# Patient Record
Sex: Female | Born: 1975 | Race: White | Marital: Single | Smoking: Former smoker
Health system: Northeastern US, Community
[De-identification: ages and names within clinical notes are randomized; demographics above are authoritative.]

## PROBLEM LIST (undated history)

## (undated) DIAGNOSIS — IMO0001 Reserved for inherently not codable concepts without codable children: Secondary | ICD-10-CM

## (undated) HISTORY — DX: Reserved for inherently not codable concepts without codable children: IMO0001

## (undated) HISTORY — PX: NO SIGNIFICANT SURGICAL HISTORY: 1000005

---

## 2010-10-24 ENCOUNTER — Encounter (HOSPITAL_BASED_OUTPATIENT_CLINIC_OR_DEPARTMENT_OTHER): Payer: Self-pay | Admitting: Allergy

## 2010-12-26 ENCOUNTER — Ambulatory Visit (HOSPITAL_BASED_OUTPATIENT_CLINIC_OR_DEPARTMENT_OTHER): Payer: PRIVATE HEALTH INSURANCE | Admitting: Internal Medicine

## 2011-01-23 ENCOUNTER — Ambulatory Visit (HOSPITAL_BASED_OUTPATIENT_CLINIC_OR_DEPARTMENT_OTHER): Payer: PRIVATE HEALTH INSURANCE | Admitting: Internal Medicine

## 2011-01-23 ENCOUNTER — Encounter (HOSPITAL_BASED_OUTPATIENT_CLINIC_OR_DEPARTMENT_OTHER): Payer: Self-pay | Admitting: Internal Medicine

## 2011-01-23 VITALS — BP 100/70 | HR 81 | Temp 97.3°F | Wt 119.0 lb

## 2011-01-23 DIAGNOSIS — K5900 Constipation, unspecified: Secondary | ICD-10-CM

## 2011-01-23 DIAGNOSIS — K2970 Gastritis, unspecified, without bleeding: Principal | ICD-10-CM

## 2011-01-23 LAB — CBC, PLATELET & DIFFERENTIAL
ABSOLUTE BASO COUNT: 0 10*3/uL (ref 0.0–0.1)
ABSOLUTE EOSINOPHIL COUNT: 0 10*3/uL (ref 0.0–0.8)
ABSOLUTE IMM GRAN COUNT: 0.02 10*3/uL (ref 0.00–0.03)
ABSOLUTE LYMPH COUNT: 2 10*3/uL (ref 0.6–5.9)
ABSOLUTE MONO COUNT: 0.5 10*3/uL (ref 0.2–1.4)
ABSOLUTE NEUTROPHIL COUNT: 4.1 10*3/uL (ref 1.6–8.3)
BASOPHIL %: 0.6 % (ref 0.0–1.2)
EOSINOPHIL %: 0.3 % (ref 0.0–7.0)
HEMATOCRIT: 38.8 % (ref 34.1–44.9)
HEMOGLOBIN: 13.1 g/dL (ref 11.2–15.7)
IMMATURE GRANULOCYTE %: 0.3 % (ref 0.0–0.4)
LYMPHOCYTE %: 29.4 % (ref 15.0–54.0)
MEAN CORP HGB CONC: 33.8 g/dL (ref 31.0–37.0)
MEAN CORPUSCULAR HGB: 28.5 pg (ref 26.0–34.0)
MEAN CORPUSCULAR VOL: 84.5 fL (ref 80.0–100.0)
MEAN PLATELET VOLUME: 11.3 fL (ref 8.7–12.5)
MONOCYTE %: 7.9 % (ref 4.0–13.0)
NEUTROPHIL %: 61.5 % (ref 40.0–75.0)
PLATELET COUNT: 302 10*3/uL (ref 150–400)
RBC DISTRIBUTION WIDTH STD DEV: 39.4 fL (ref 35.1–46.3)
RBC DISTRIBUTION WIDTH: 12.8 % (ref 11.5–14.3)
RED BLOOD CELL COUNT: 4.59 M/uL (ref 3.90–5.20)
WHITE BLOOD CELL COUNT: 6.7 10*3/uL (ref 4.0–11.0)

## 2011-01-23 MED ORDER — RANITIDINE HCL 300 MG PO CAPS
300.0000 mg | ORAL_CAPSULE | Freq: Every evening | ORAL | Status: DC
Start: 2011-01-23 — End: 2011-02-22

## 2011-01-23 NOTE — Progress Notes (Signed)
S/ Christy Gonzales is a 35 year old female is new pt.     Here for:     564.00A Constipation  535.44F Gastritis  - c/o in last 3 months, note incr midepig discomfort, esp if not eat and or sometimes after meals. Taking PPI BID for last 26mo and this helps but still w/ mild symptoms. Reports hx of gastritis/dudodenitis, scoped in [place] 2005. Was takign PPI on prn basis prior, few times per wk, but in last 26mo, taking daily. She admits increase stressors may be factor, but denies any dietary changes.   ROS GI: The patient denies  anorexia, nausea or vomiting, dysphagia, change in bowel habits or black or bloody stools.    -also noted 4 days ago had bad lower abd pain that radiated to low back side, resolved after 1 day;s he is not takign nsaids, but does occas take tylenol w/ relief. FDMP 11/26, regular.   Admits does have menstrual cramps.     Patient Active Problem List    Gastritis [535.44F]         Date Noted: 01/24/2011            2005- reports had EGD in [place] showing duodenitis            and gastritis- given PPI w/ relief and dietary            discretion.      Constipation [564.00A]         Date Noted: 01/23/2011            Since childhood, takes fiber prn, BM 2-3x per wk.         Meds: form [facility]: omeprazole 20 mg BID for last 26months.     FDLMP 11/26, regular       Social History   Marital Status: Single  Spouse Name: N/A    Years of Education: N/A  Number of Children: 0     Occupational History  housecleaning       Social History Main Topics   Smoking status: Never Smoker     Smokeless tobacco:     Alcohol Use: No    Drug Use: No    Sexually Active: Not Currently  Partner(s): Female     Other Topics Concern   None on file     Social History Narrative    01/2011- emigrated from [place] 2005, where her family stil resides.    -single,  lives w/ cousin, safe, no DV.     -working in Human resources officer       Family History    GI Mother     Comment: hx PUD, had 'ulcer surgery''     Hypertension Mother     Stroke Mother      GI Sister     GI Sister        OBJECTIVE:  BP 100/70  Pulse 81  Temp(Src) 97.3 F (36.3 C) (Oral)  Wt 119 lb (53.978 kg)  SpO2 97%  GENERAL: WD, WN, NAD, A&O x3.  HEENT: op clear, neck supple  CVS/LUNGS: S1 and S2 normal, no murmurs, clicks, gallops or rubs. Regular rate and rhythm. Chest is clear; no wheezes or rales. No edema or JVD.  ABD: The abdomen is soft without tenderness, guarding, mass, rebound or organomegaly. Bowel sounds are normal. No CVA tenderness or inguinal adenopathy noted.  BACK: Cervical, thoracic and lumbar spine exam is normal without tenderness, masses or kyphoscoliosis. Full range of motion without pain is  noted.    ASSESSMENT:  535.9F Gastritis  Comment: new pt: has hx of this, and seems to have worsening of symptoms in last 3 mo despite taking PPI. Admit recent stressors but denies nsaid use or other diet indiscretions; denies rectal bleeding/black stools; Ddx: PUD. Advise her to cont PPI BID for now, and will add H2 blocker at bedtime; keep food diary, counsel on diet discretion again.  Plan: ROUTINE VENIPUNCTURE, CBC + PLT + AUTO DIFF,         COMPREHENSIVE METABOLIC PANEL, HELICOBACTER         PYLORI, IGG        -consider GI referral for EGD repeat.       564.00A Constipation  Comment: - long hx of this since childhood; no change of BM pattern; encourage her to take daily fiber supplement, ie: flax seed oil.   Plan: THYROID SCREEN TSH          rtc for PE;pap    DISP- pt has been well, and not see doctor or had any ED visits in over 7 yrs since arrived to [place].

## 2011-01-24 ENCOUNTER — Encounter (HOSPITAL_BASED_OUTPATIENT_CLINIC_OR_DEPARTMENT_OTHER): Payer: Self-pay | Admitting: Internal Medicine

## 2011-01-24 DIAGNOSIS — K2970 Gastritis, unspecified, without bleeding: Principal | ICD-10-CM | POA: Insufficient documentation

## 2011-01-31 LAB — COMPREHENSIVE METABOLIC PANEL
ALANINE AMINOTRANSFERASE: 19 IU/L (ref 7–35)
ALBUMIN: 4.2 g/dl (ref 3.4–4.8)
ALKALINE PHOSPHATASE: 51 IU/L (ref 25–106)
ANION GAP: 9 mmol/L (ref 3–11)
ASPARTATE AMINOTRANSFERASE: 21 IU/L (ref 8–34)
BILIRUBIN TOTAL: 0.4 mg/dl (ref 0.2–1.1)
BUN (UREA NITROGEN): 9 mg/dl (ref 6–20)
CALCIUM: 9.6 mg/dl (ref 8.6–10.3)
CARBON DIOXIDE: 28 mmol/L (ref 22–32)
CHLORIDE: 102 mmol/L (ref 101–111)
CREATININE: 0.7 mg/dl (ref 0.4–1.2)
ESTIMATED GLOMERULAR FILT RATE: >60 mL/min (ref >60)
Glucose Random: 96 mg/dl (ref 74–160)
POTASSIUM: 3.9 mmol/L (ref 3.5–5.1)
SODIUM: 139 mmol/L (ref 135–144)
TOTAL PROTEIN: 7.2 g/dl (ref 5.9–7.5)

## 2011-01-31 LAB — HELICOBACTER PYLORI IGG: HELICOBACTER PYLORI IGG: NEGATIVE

## 2011-01-31 LAB — THYROID SCREEN TSH REFLEX FT4: THYROID SCREEN TSH REFLEX FT4: 1.71 u[IU]/mL (ref 0.34–5.60)

## 2011-02-13 ENCOUNTER — Encounter (HOSPITAL_BASED_OUTPATIENT_CLINIC_OR_DEPARTMENT_OTHER): Payer: Self-pay | Admitting: Internal Medicine

## 2011-02-22 ENCOUNTER — Ambulatory Visit (HOSPITAL_BASED_OUTPATIENT_CLINIC_OR_DEPARTMENT_OTHER): Payer: PRIVATE HEALTH INSURANCE | Admitting: Internal Medicine

## 2011-02-22 ENCOUNTER — Encounter (HOSPITAL_BASED_OUTPATIENT_CLINIC_OR_DEPARTMENT_OTHER): Payer: Self-pay | Admitting: Internal Medicine

## 2011-02-22 VITALS — BP 100/60 | HR 86 | Temp 98.1°F | Wt 123.0 lb

## 2011-02-22 DIAGNOSIS — R5383 Other fatigue: Secondary | ICD-10-CM

## 2011-02-22 DIAGNOSIS — F419 Anxiety disorder, unspecified: Secondary | ICD-10-CM

## 2011-02-22 DIAGNOSIS — M62838 Other muscle spasm: Secondary | ICD-10-CM

## 2011-02-22 DIAGNOSIS — K2970 Gastritis, unspecified, without bleeding: Principal | ICD-10-CM

## 2011-02-22 DIAGNOSIS — J329 Chronic sinusitis, unspecified: Secondary | ICD-10-CM

## 2011-02-22 DIAGNOSIS — F439 Reaction to severe stress, unspecified: Secondary | ICD-10-CM

## 2011-02-22 MED ORDER — PSEUDOEPHEDRINE HCL ER 120 MG PO TB12
120.00 mg | ORAL_TABLET | Freq: Two times a day (BID) | ORAL | Status: AC
Start: 2011-02-22 — End: 2011-03-04

## 2011-02-22 MED ORDER — OMEPRAZOLE 40 MG PO CPDR
40.00 mg | DELAYED_RELEASE_CAPSULE | Freq: Every day | ORAL | Status: AC
Start: 2011-02-22 — End: 2011-03-24

## 2011-02-22 MED ORDER — CYCLOBENZAPRINE HCL 10 MG PO TABS
10.00 mg | ORAL_TABLET | Freq: Two times a day (BID) | ORAL | Status: AC | PRN
Start: 2011-02-22 — End: 2011-03-01

## 2011-02-22 MED ORDER — RANITIDINE HCL 300 MG PO CAPS
300.0000 mg | ORAL_CAPSULE | Freq: Every evening | ORAL | Status: AC
Start: 2011-02-22 — End: 2011-03-24

## 2011-02-22 NOTE — Progress Notes (Signed)
S/ Christy Gonzales is a 36 year old female is here for f/u and has several concerns: via telephone interpreter.    535.62F Gastritis  (primary encounter diagnosis)  -reports long hx of this, recurrent GI symptoms for last 3 mo, despite takign PPI BID. I saw her last month, asked to watch diet, reviewed discretion, and had her add H2 blocker in PM, which she was done. Notes slight impomvement, has cut out tomatoes and spicy foods which make symptoms worse.   ROS GI: The patient denies abdominal or flank pain, anorexia, nausea or vomiting, dysphagia, change in bowel habits or black or bloody stools. No wt loss.     V62.89N Stress  - ongoing stressors which she wish not to go into, but agreed may be contributing to GI symptoms.   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     473.9E Sinusitis  - c/o 4 days of cold/cough/congesitno, and left sinus pressure; taking tyelenol w/ some relief. Wants to know if need abx for her 'infxn' as she has had sinus infection in past   ROS: no fever, no chills. No sore throat. Nasal d/c is scant. Cough is scant.     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   ROS: The patient denies swelling, numbness, tingling or weakness in the upper extremities.    780.79B Fatigue  300.00E Anxiety  -- she reports being tired, and long hx of being easily anxious. Easily worried about things, ie, if have a doctor's appt coming up, she worries about it and then has poor sleep. She wants something to help her sleep b/c feels that is what makes her tired, lack of good sleep.     Patient Active Problem List:     Constipation [564.00A]     Gastritis [535.62F]      No current outpatient prescriptions on file prior to encounter.  OBJECTIVE:  BP 100/60  Pulse 86  Temp(Src) 98.1 F (36.7 C) (Oral)  Wt 123 lb (55.792 kg)  SpO2  99%  GENERAL: WD, WN, NAD, A&O x3. Well appearing, not ill.   Mental status exam; she is alert, orient to time, person and place. Normal thought content, speech, affect, mood and dress are noted.  HEENT: eomi, perrla, op clear; turbs- slightly erythematous but no nasal d/c; left maxillary sinus pain to palpation; ears- wnl; neck supple w/out LAD  CVS/LUNGS: S1 and S2 normal, no murmurs, clicks, gallops or rubs. Regular rate and rhythm. Chest is clear; no wheezes or rales. No edema or JVD.  LYMPH: No lymphadenopathy in the anterior or posterior neck, supraclavicular, axillary or inguinal areas. No hepato-splenomegaly noted.  SKIN: I note only benign skin findings. No unusual rashes or suspicious skin lesions noted. Nails appear normal.    ASSESSMENT:  535.62F Gastritis  (primary encounter diagnosis)  Comment: - reports long hx of this, EGD in [place] showing gastrits/dudoenitis; recurrent GI symptoms for last 3 mo, despite takign PPI BID. I  had asked her to watch diet, reviewed discretion, and had her add H2 blocker in PM, which she was done. Notes slight impomvement, has cut out tomatoes and spicy foods which make symptoms worse.  Refer GI. RF PPI/H2 blocker for now.   Plan: REFERRAL TO GASTROENTEROLOGY ( INT), omeprazole        (PRILOSEC) 40  MG capsule, ranitidine (ZANTAC)         300 MG capsule            V62.89N Stress  Comment: - denies depression and not wish to go into details w/ me; she is open to mental health counseling.   Plan: REFERRAL TO ADULT PSYCHIATRY ( INT)            473.9E Sinusitis  Comment: - left maxillary pain x 4 days; supportive care w/ fluids, nsaids, decongestant.   Plan: pseudoephedrine (SUDAFED 12 HOUR) 120 MG 12 hr         tablet            728.85FS Muscle spasms of neck  Comment: -work related. Trial of flexeril prn, esp qhs, as may help w/ her poor sleep.   Plan: cyclobenzaprine (FLEXERIL) 10 MG tablet        Potential medication side effects were discussed with the patient; let me  know if any occur.    780.79B Fatigue  Comment: - suspect mental health issues as main factor. No constitutional symptoms. clinically euthryoid.   Plan: REFERRAL TO ADULT PSYCHIATRY ( INT)        Most Recent Weight Reading(s)  02/22/11 : 123 lb (55.792 kg)  01/23/11 : 119 lb (53.978 kg)    300.00E Anxiety  Comment: - reports long hx of easily getting anxious which then interferes w/ her sleep quality. Requesting med to help her sleep.   Plan: - advise her to f/u w/ mental heath    rtc for PE/labs and PHQ9  screening    I have reviewed the past medical, surgical, social and family history and updated these sections of EpicCare as relevant. All interim labs, test results, and consult notes were reviewed and discussed with Christy Gonzales. Medications were reconciled during this visit and a current medication list was given to the patient at the end of the visit.

## 2011-02-23 DIAGNOSIS — F439 Reaction to severe stress, unspecified: Secondary | ICD-10-CM | POA: Insufficient documentation

## 2011-02-23 DIAGNOSIS — F419 Anxiety disorder, unspecified: Secondary | ICD-10-CM | POA: Insufficient documentation

## 2011-03-03 ENCOUNTER — Telehealth (HOSPITAL_BASED_OUTPATIENT_CLINIC_OR_DEPARTMENT_OTHER): Payer: Self-pay | Admitting: Internal Medicine

## 2011-03-03 NOTE — Telephone Encounter (Incomplete)
{  API STATUS:[id number]}

## 2011-03-27 ENCOUNTER — Telehealth (HOSPITAL_BASED_OUTPATIENT_CLINIC_OR_DEPARTMENT_OTHER): Payer: Self-pay | Admitting: Internal Medicine

## 2011-03-27 NOTE — Telephone Encounter (Signed)
1st outreach for pe/pap smear  L/M for patient to reschedule pe/pap smear.    [employer]

## 2011-04-03 ENCOUNTER — Ambulatory Visit (HOSPITAL_BASED_OUTPATIENT_CLINIC_OR_DEPARTMENT_OTHER): Payer: Self-pay

## 2011-04-06 ENCOUNTER — Ambulatory Visit (HOSPITAL_BASED_OUTPATIENT_CLINIC_OR_DEPARTMENT_OTHER): Payer: PRIVATE HEALTH INSURANCE | Admitting: Physician Assistant

## 2011-04-21 ENCOUNTER — Telehealth (HOSPITAL_BASED_OUTPATIENT_CLINIC_OR_DEPARTMENT_OTHER): Payer: Self-pay | Admitting: Internal Medicine

## 2011-04-21 ENCOUNTER — Encounter (HOSPITAL_BASED_OUTPATIENT_CLINIC_OR_DEPARTMENT_OTHER): Payer: Self-pay | Admitting: Internal Medicine

## 2011-04-21 NOTE — Telephone Encounter (Signed)
2nd outreach for PE & pap  L/M in [place] for patient to call office & schedule PE/pap    Howard Pouch

## 2011-05-30 ENCOUNTER — Ambulatory Visit (HOSPITAL_BASED_OUTPATIENT_CLINIC_OR_DEPARTMENT_OTHER): Payer: PRIVATE HEALTH INSURANCE | Admitting: Gastroenterology

## 2011-05-30 ENCOUNTER — Ambulatory Visit (HOSPITAL_BASED_OUTPATIENT_CLINIC_OR_DEPARTMENT_OTHER): Payer: Self-pay | Admitting: Gastroenterology

## 2011-05-30 VITALS — BP 92/55 | HR 88 | Temp 97.6°F | Resp 16 | Wt 119.0 lb

## 2011-05-30 DIAGNOSIS — K219 Gastro-esophageal reflux disease without esophagitis: Principal | ICD-10-CM

## 2011-05-30 DIAGNOSIS — R109 Unspecified abdominal pain: Secondary | ICD-10-CM

## 2011-05-30 MED ORDER — OMEPRAZOLE 20 MG PO CPDR
20.00 mg | DELAYED_RELEASE_CAPSULE | Freq: Two times a day (BID) | ORAL | Status: AC
Start: 2011-05-30 — End: 2012-05-29

## 2011-05-30 NOTE — Progress Notes (Signed)
This office note has been dictated. Account number [id number]

## 2011-05-31 NOTE — Progress Notes (Signed)
Date of Service: 05/30/2011    Ms. Christy Gonzales was seen in consultation at the request of Dr. Alfonse Ras for burning epigastric pain and heartburn, lower abdominal cramping, and constipation.    The patient was seen in the presence of a Portuguese-speaking interpreter.    HISTORY OF PRESENT ILLNESS:  The patient is a 36 year old woman who has had epigastric pain and heartburn for many years.  She recently has been waking up at night choking from her reflux secretions.  She was placed on omeprazole, which helps, but this was switched to ranitidine, which does not help as much.  She has had endoscopies in [place] that showed gastritis and esophagitis about 8 years ago.    The other complaint is periumbilical and lower abdominal cramping, which occurs every 2-3 weeks and is worse during times of stress and around the time of her period.  She has never been told that she has endometriosis.  She has not had any children and does not recall whether birth control pills, which she has taken in the past, made a difference the pain that she gets.    The third problem is constipation, and this sometimes makes her abdominal pain worse, and passage of gas or a bowel movement can make it a little better.  She denies having any blood in the stools.    PAST MEDICAL HISTORY:  Constipation, gastritis, and heartburn.    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    FAMILY HISTORY:  Her mother had ulcer disease.    ALLERGIES:  No known allergies.    MEDICATIONS:  Ranitidine.    REVIEW OF SYSTEMS:  She denies any fever, chills, or decreased appetite.  She has lost a few pounds, which she attributes to her present illness.  She denies any visual or auditory symptoms.  She denies chest pain or palpitations.  She denies breast pain or discharge.  She denies chronic cough or shortness of breath.  GI symptomatology as in the present illness.  She denies any genitourinary symptoms.  Her periods are painful but not severely  so.  She denies joint pain or swelling.  She denies skin lesions or rash.  She denies loss of consciousness or seizures.    PHYSICAL EXAMINATION:  GENERAL:  She is healthy looking, in no distress.  VITAL SIGNS:  Blood pressure 92/55, pulse 88, temperature 97.6, oxygen saturation 100%, and weight 119 pounds.  HEAD AND ENT:  No icterus, pallor, or mouth lesions.  NECK:  No venous congestion or masses.  HEART:  Rhythmic and regular with no murmurs.  LUNGS:  Clear and resonant.  ABDOMEN:  The abdomen is soft with no organomegaly or masses.  There is epigastric tenderness.  EXTREMITIES:  No edema.  SKIN:  No lesions.    ASSESSMENT AND PLAN:  This patient has had heartburn and burning epigastric pain for many years, and it is important to perform an endoscopy to rule out Barrett's esophagus.  She is on the constipated side, so I discussed with her the diet that she should follow and told her to use a bulk-forming agent such as Citrucel or Metamucil on a regular basis.    ___________________________  Reviewed and Electronically Signed By: Marrianne Mood MD  Sig Date: 05/31/2011  Sig Time: 10:03:04  Dictated By: Marrianne Mood MD  Dict Date: 05/30/2011 Dict Time: 01 57 PM    Dictation Date and Time:05/30/2011 13:57:21  Transcription Date and Time:05/30/2011 15:25:13  eScription Dictation id: [id number] Confirmation # :[id number]      cc: Westley Chandler MD

## 2011-06-08 ENCOUNTER — Encounter (HOSPITAL_BASED_OUTPATIENT_CLINIC_OR_DEPARTMENT_OTHER): Payer: Self-pay | Admitting: Gastroenterology

## 2011-06-08 ENCOUNTER — Ambulatory Visit (HOSPITAL_BASED_OUTPATIENT_CLINIC_OR_DEPARTMENT_OTHER)
Admit: 2011-06-08 | Disposition: A | Discharge status: Home / Self Care | Payer: Self-pay | Source: Ambulatory Visit | Attending: Gastroenterology | Admitting: Gastroenterology

## 2011-06-08 LAB — GI OPERATIVE NOTE

## 2011-06-08 MED ORDER — DIPHENHYDRAMINE HCL 50 MG/ML IJ SOLN
25.0000 mg | Freq: Once | INTRAMUSCULAR | Status: DC | PRN
Start: 2011-06-08 — End: 2011-06-08

## 2011-06-08 MED ORDER — SODIUM CHLORIDE 0.9 % IV SOLN
INTRAVENOUS | Status: DC
Start: 2011-06-08 — End: 2011-06-08

## 2011-06-08 MED ORDER — MIDAZOLAM HCL 5 MG/5ML IJ SOLN
INTRAMUSCULAR | Status: AC
Start: 2011-06-08 — End: 2011-06-08
  Administered 2011-06-08: 6 mg via INTRAVENOUS
  Filled 2011-06-08: qty 10

## 2011-06-08 MED ORDER — MIDAZOLAM HCL 5 MG/5ML IJ SOLN
0.5000 mg | INTRAMUSCULAR | Status: DC
Start: 2011-06-08 — End: 2011-06-08

## 2011-06-08 MED ORDER — SODIUM CHLORIDE 0.9 % IV SOLN
INTRAVENOUS | Status: DC
Start: 2011-06-08 — End: 2011-06-08
  Filled 2011-06-08: qty 500

## 2011-06-08 MED ORDER — SIMETHICONE 40 MG/0.6ML PO SUSP
Freq: Once | ORAL | Status: DC
Start: 2011-06-08 — End: 2011-06-08

## 2011-06-08 MED ORDER — FENTANYL CITRATE 0.05 MG/ML IJ SOLN
25.0000 ug | INTRAMUSCULAR | Status: DC
Start: 2011-06-08 — End: 2011-06-08

## 2011-06-08 MED ORDER — FENTANYL CITRATE 0.05 MG/ML IJ SOLN
INTRAMUSCULAR | Status: AC
Start: 2011-06-08 — End: 2011-06-08
  Administered 2011-06-08: 150 ug via INTRAVENOUS
  Filled 2011-06-08: qty 4

## 2011-06-08 MED ORDER — WATER FOR IRRIGATION, STERILE IR SOLN
Freq: Once | Status: DC
Start: 2011-06-08 — End: 2011-06-08

## 2011-06-08 NOTE — H&P (Signed)
GI Pre-procedure History and Physical Short Form  Christy Gonzales is an 36 year old female.    Chief Complaint: She is being scheduled for Upper GI endoscopy    The history is provided by the patient. No language interpreter was used.           Active Problems:  Patient Active Problem List:     Constipation [564.00A]     Gastritis [535.20F]     Anxiety [300.00E]     Stress [V62.89N]      History (Medical, Surgical, Social, Family):  History reviewed. No pertinent past medical history.    Past Surgical History    NO SIGNIFICANT SURGICAL HISTORY          Social History   Marital Status: Single  Spouse Name: N/A    Years of Education: N/A  Number of Children: 0     Occupational History  housecleaning       Social History Main Topics   Smoking status: Never Smoker     Smokeless tobacco:     Alcohol Use: No    Drug Use: No    Sexually Active: Not Currently  Partner(s): Female     Other Topics Concern   None on file     Social History Narrative    01/2011- emigrated from [place] 2005, where her family still resides.    -single,  lives w/ cousin, safe, no DV.     -working in Human resources officer       Family History    GI Mother     Comment: hx PUD, had 'ulcer surgery''     Hypertension Mother     Stroke Mother     GI Sister     GI Sister          Allergies:   Review of Patient's Allergies indicates:  No Known Allergies    Medications:     Current outpatient prescriptions ordered prior to encounter:  ranitidine (ZANTAC) 150 MG tablet Take 150 mg by mouth 2 (two) times daily. Disp:  Rfl:    omeprazole (PRILOSEC) 20 MG capsule Take 1 capsule by mouth 2 (two) times daily. Disp: 60 capsule Rfl: 12         Vitals:  BP 117/73  Pulse 80  Temp(Src) 98.5 F (36.9 C) (Temporal)  Resp 14  Ht 5' 1.81" (1.57 m)  Wt 120 lb (54.432 kg)  BMI 22.08 kg/m2  SpO2 100%  LMP 06/01/2011    Review of Systems   Constitutional: Negative for fever, appetite change and fatigue.   HENT: Negative.    Respiratory: Negative.    Cardiovascular: Negative.     Gastrointestinal: Negative.    Neurological: Negative.           Physical Exam   Vitals reviewed.  Constitutional: She is oriented to person, place, and time. She appears well-developed and well-nourished. No distress.   HENT:   Head: Normocephalic and atraumatic.   Mouth/Throat: Oropharynx is clear and moist.   Eyes: Conjunctivae are normal.   Neck: Normal range of motion. Neck supple.   Cardiovascular: Normal rate and normal heart sounds.    No murmur heard.  Pulmonary/Chest: Effort normal and breath sounds normal.   Abdominal: Soft. Bowel sounds are normal. She exhibits no mass (No organomegaly).   Musculoskeletal: She exhibits no edema.   Neurological: She is alert and oriented to person, place, and time.   Skin: Skin is warm and dry. She is not diaphoretic.  Airway Evaluation:  Gag reflex intact: Yes  Ability to open mouth wide:   Full  Dentures:  No  Loose teeth:  No  Neck range of motion  Full    Mallampati Airway Classification: Class I     A soft palate, fauces, uvula, anterior and posterior tonsil pillars are seen.  Mallampati Airway Classification:     ASA Classification: ASA Class I (a normally healthy patient)    Assessment:  Proceed with procedure

## 2011-06-08 NOTE — Discharge Instructions (Signed)
[facility] DISCHARGE INSTRUCTIONS     Quando voc for para casa  possvel que se sinta sonolento(a). Descanse bastante pelo resto do dia.   When you return home you may feel sleepy. Get plenty of rest for the remainder of the day.     Se voc tiver recebido algum sedative para o procedimento NO DIRIJA, NO  MANUSEIE MQUINAS NEM TOME NENHUMA DECISO IMPORTANTE durante o resto do dia.  If you received sedation for your procedure DO NOT DRIVE, OPERATE MACHINERY, OR MAKE IMPORTANT DECISIONS for the remainder of the day.     Aps ter feito uma ENDOSCOPIA DIGESTIVA ALTA (GASTROENTEROSCOPIA)  normal se ter uma dor de garganta branda que pode durar por alguns [place], mas se voc tiver  DORES NO PDepois ter feito uma COLONOSCOPIA  normal ter gases e sentir inchao abdominal, mas se voc tiver uma DOR ABDOMINAL SEVERA entre em contato com o seu mdico imediatamente.   It is normal after having a COLONOSCOPY to feel a little gassy and bloated, but if you develop SEVERE ABDOMINAL PAIN call your doctor immediately.      Se tiver feito uma bipsia ou Polipectomia voc ter que suspender a aspirina ou os medicamentos que contenham aspirina por {3} [place].                  If you had a biopsy or Polypectomy you will need to hold aspirin or medication containing aspirin for ___days.     Se voc tiver feito uma bipsia ou Polipectomia voc ter que suspender o medicamento coumadin por {NUMBERS [id number] [place].                                           If you had a biopsy or Polypectomy you will need to hold your coumadin for___days.     Se voc tiver feito uma bipsia ou Polipectomia voc ter que suspender qualquer medicamento tais como Advil, Motrin, Naproxin, Ibuprofen etc por {3} [place].  If you had a biopsy or Polypectomy you will need to hold any medication such as Advil, Motrin, Naproxin, Ibuprofen etc for___days.     Entre em contato com o seu mdico se houver qualquer  outro sintoma fora do normal.  Call you physician for any other unusual symptoms.     Se por qualquer razo voc no conseguir entrar em contato com o seu mdico v para a sala de [facility] pr[facility]a.   If for any reason you are unable to reach your doctor go to the nearest Emergency Room.    Instrues Especficas:***  Specific Instructions: Hiatal hernia. One esophageal polyp removed. Await pathology results. Keep follow up appointment,    Preoperatively reviewed by Admitting RN

## 2011-06-13 LAB — SURGICAL PATH SPECIMEN

## 2011-07-12 ENCOUNTER — Ambulatory Visit (HOSPITAL_BASED_OUTPATIENT_CLINIC_OR_DEPARTMENT_OTHER): Payer: PRIVATE HEALTH INSURANCE | Admitting: Family Medicine

## 2011-07-31 ENCOUNTER — Ambulatory Visit (HOSPITAL_BASED_OUTPATIENT_CLINIC_OR_DEPARTMENT_OTHER): Payer: PRIVATE HEALTH INSURANCE | Admitting: Family Medicine

## 2011-07-31 ENCOUNTER — Encounter (HOSPITAL_BASED_OUTPATIENT_CLINIC_OR_DEPARTMENT_OTHER): Payer: Self-pay | Admitting: Family Medicine

## 2011-07-31 VITALS — BP 100/60 | HR 68 | Temp 96.6°F | Ht 62.6 in | Wt 122.0 lb

## 2011-07-31 DIAGNOSIS — G8929 Other chronic pain: Secondary | ICD-10-CM

## 2011-07-31 DIAGNOSIS — Z0000 Encounter for general adult medical examination without abnormal findings: Principal | ICD-10-CM

## 2011-07-31 DIAGNOSIS — L301 Dyshidrosis [pompholyx]: Secondary | ICD-10-CM

## 2011-07-31 DIAGNOSIS — A630 Anogenital (venereal) warts: Secondary | ICD-10-CM

## 2011-07-31 DIAGNOSIS — R102 Pelvic and perineal pain: Secondary | ICD-10-CM

## 2011-07-31 LAB — URINALYSIS
BILIRUBIN, URINE: NEGATIVE
CASTS: NONE SEEN PER LPF
CRYSTALS: NONE SEEN
GLUCOSE, URINE: NEGATIVE MG/DL
KETONE, URINE: NEGATIVE MG/DL
LEUKOCYTE ESTERASE: NEGATIVE
NITRITE, URINE: NEGATIVE
PH URINE: 7 (ref 5.0–8.0)
PROTEIN, URINE: NEGATIVE MG/DL
SPECIFIC GRAVITY URINE: 1.023 (ref 1.003–1.035)

## 2011-07-31 LAB — URINE DIP (POINT OF CARE)
BILIRUBIN, URINE: NEGATIVE
GLUCOSE, URINE: NEGATIVE mg/dl
KETONE, URINE: NEGATIVE mg/dl
LEUKOCYTE ESTERASE: NEGATIVE
NITRITE, URINE: NEGATIVE
PH URINE: 7.5 (ref 5.0–8.0)
PROTEIN, URINE: NEGATIVE mg/dl (ref 0–15)
SPECIFIC GRAVITY URINE: 1.01 (ref 1.003–1.030)
UROBILINOGEN URINE: 0.2 mg/dl (ref 0.2–1.0)

## 2011-07-31 MED ORDER — TRIAMCINOLONE ACETONIDE 0.1 % EX OINT
TOPICAL_OINTMENT | Freq: Two times a day (BID) | CUTANEOUS | Status: AC
Start: 2011-07-31 — End: 2011-08-10

## 2011-07-31 NOTE — Progress Notes (Signed)
Christy Gonzales is a 36 year old female here for physical and pap smear.    Abdominal pain:  - has seen GI, endoscopy showed hiatal hernia, otherwise normal  - thought she has endometriosis  - lower abdomen, b/l  - almost daily when wakes up in am, sometimes during day  - sometimes like cramps  - is taking fiber supplements, bowel movements daily  - not improved with bowel movement  - sometimes very hard, sometimes soft  - somedays lasts all day, sometimes goes away  - when has the pain doesn't eat much, drinks more fluid  - sometimes worse about 1 week before and after her menses  - no blood in stool  - nothing makes it better or worse  - sometimes heavy periods, sometimes not  - LMP 07/17/2011  - no urinary symptoms    Hand:  - small little bumps and cracks on fingers  - hurts and pruritic  - works in cleaning  - wears gloves     Genital lesion:  - has been there for 1.5 years  - removed it by herself before  - now back  - not itchy or painful  - last sexual contact 3 years ago  - no hx of STI's    REVIEW OF SYSTEMS: otherwise feels well, no HA, no CP, no SOB, no chills, no significant increase or decreased in weight, no joint pain      Patient Active Problem List:     Constipation     Gastritis     Anxiety     Stress        Current Outpatient Prescriptions on File Prior to Visit:  ranitidine (ZANTAC) 150 MG tablet Take 150 mg by mouth 2 (two) times daily. Disp:  Rfl:    omeprazole (PRILOSEC) 20 MG capsule Take 1 capsule by mouth 2 (two) times daily. Disp: 60 capsule Rfl: 12     No current facility-administered medications on file prior to visit.    Review of Patient's Allergies indicates:  No Known Allergies      Past Medical History    No significant past medical history            Past Surgical History    NO SIGNIFICANT SURGICAL HISTORY           Family History    GI Mother     Comment: hx PUD, had 'ulcer surgery''     Hypertension Mother     Stroke Mother     GI Sister     GI Sister     Heart FamHxNeg     Diabetes  FamHxNeg          Social History   Marital Status: Single  Spouse Name: N/A    Years of Education: N/A  Number of Children: 0     Occupational History  housecleaning       Social History Main Topics   Smoking status: Former Smoker     Quit date: 07/31/2003    Smokeless tobacco:     Alcohol Use: No    Drug Use: No    Sexually Active: Not Currently  Partner(s): Female    Comment: last sexual encounter 3 years ago     Other Topics Concern   None on file     Social History Narrative    01/2011- emigrated from [place] 2005, where her family still resides.    -single,  lives w/ cousin, safe, no DV.     -  working in house cleaning        BP 100/60  Pulse 68  Temp(Src) 96.6 F (35.9 C) (Oral)  Ht 5' 2.6" (1.59 m)  Wt 122 lb (55.339 kg)  BMI 21.89 kg/m2  SpO2 100%  LMP 07/17/2011  GEN: average weight female, comfortable, NAD, well groomed  HEENT- NC/A. PERRLA, MMM. Tympanic membranes normal,  No LAD, no thyroidmegaly.   Resp- CTAB, no wheezes, rales, ronchi.  CV- RRR, no m/g/r  Abd- Soft, non-tender on palpation. No masses.  No HSM. Normoactive bowel sounds.  MSK- Strength 5/5 throughout. Normal gait.   Ext- No edema, clubbing, or cyanosis. Pulses 2+ throughout.   Neuro- Alert and oriented x3, CN II-XII grossly intact. Reflexes 2+ throughout  Pelvic exam: normal vagina and vulva, 5 mm flesh colored pedenculated lesion above left labia majora, normal cervix without lesions, polyps, + tenderness, nulliparous os, uterus normal size, shape, consistency, no mass, but + tenderness, adnexa normal in size without mass or tenderness, pap smear done today, DNA probe for GC/chlamydia obtained.  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 Of endometriosis, worse with menses, pain with pelvic exam, also consider gonorrhea/chlamydia infection  Plan: ORDER FOR ULTRASOUND, URINE DIP (POINT OF         CARE), URINALYSIS, GC/chlamydia  - u/s, if normal and UA normal, consider trial of OCPs    (078.11) Genital warts  Comment: exam and hx c/w this  Plan: return in 2 weeks f/u above, will use cryotherapy at that time for genital wart      *Patient understood and agreed with plan  *Follow up prn  *AVS given  *All questions addressed    *I have reviewed the past medical, surgical, social and family history and updated these sections of EpicCare as relevant. All interim labs, test results, and consult notes were reviewed and discussed. Medications were reconciled during this visit and a current medication list was given to the patient at the end of the visit.     Arleta Creek, MD

## 2011-08-01 LAB — HUMAN PAPILLOMAVIRUS (HPV): HUMAN PAPILLOMAVIRUS: NEGATIVE

## 2011-08-01 LAB — CHLAMYDIA GC NAAT
GENPROBE CHLAMYDIA: NEGATIVE
GENPROBE GC: NEGATIVE

## 2011-08-02 LAB — HIV 1 AND 2 PLUS O ANTIBODY: HIV 1 AND 2 PLUS O SCREEN: NONREACTIVE

## 2011-08-02 LAB — CYTOPATH, C/V, THIN LAYER

## 2011-08-02 NOTE — Addendum Note (Signed)
Addended byArleta Creek on: 08/02/2011 08:51 AM     Modules accepted: Orders

## 2011-08-03 LAB — URINE CULTURE/COLONY COUNT

## 2011-08-24 ENCOUNTER — Ambulatory Visit (HOSPITAL_BASED_OUTPATIENT_CLINIC_OR_DEPARTMENT_OTHER): Payer: PRIVATE HEALTH INSURANCE | Admitting: Gastroenterology

## 2011-08-24 VITALS — BP 101/62 | HR 53 | Temp 97.8°F | Resp 16 | Wt 122.0 lb

## 2011-08-24 DIAGNOSIS — K5900 Constipation, unspecified: Secondary | ICD-10-CM

## 2011-08-24 DIAGNOSIS — K449 Diaphragmatic hernia without obstruction or gangrene: Secondary | ICD-10-CM

## 2011-08-24 DIAGNOSIS — K228 Other specified diseases of esophagus: Principal | ICD-10-CM

## 2011-08-24 DIAGNOSIS — K2281 Esophageal polyp: Secondary | ICD-10-CM

## 2011-08-24 NOTE — Progress Notes (Signed)
This office note has been dictated. Account number [id number]

## 2011-08-25 NOTE — Progress Notes (Signed)
Date of Service: 08/24/2011    HISTORY OF PRESENT ILLNESS:  Christy Gonzales was seen in followup on August 24, 2011 after she had an upper endoscopy to investigate epigastric pain.    She was seen today in the presence of a Portuguese-speaking interpreter.    The upper endoscopy showed a polypoid lesion in the distal esophagus, but no other abnormality.  The pathology of the lesion showed squamous epithelium with slight papillary architecture suggestive of a squamous papilloma.    The patient complains of abdominal pain, constipation, and bloating.  These complaints have been present for a long time.    PHYSICAL EXAMINATION:  GENERAL:  She is healthy looking and in no distress.  VITAL SIGNS:  Blood pressure 101/62, pulse 53, temperature 97.8, and weight 122 pounds.  ABDOMEN:  Shows no organomegaly, masses, or tenderness.    ASSESSMENT AND PLAN:  I explained the findings to the patient, and I told her that the lesion that was seen is usually benign, but it is reasonable to perform another endoscopy in a year's time to make sure that it has not regrown.    I also told her that her complaints are most compatible with irritable bowel.  She said that she tried taking Citrucel for her constipation, but this did not work.  She found on her own that Jannifer Hick works well when she takes it, so I advised her to take Downs on a regular basis.    ___________________________  Reviewed and Electronically Signed By: Marrianne Mood MD  Sig Date: 08/28/2011  Sig Time: 14:56:46  Dictated By: Marrianne Mood MD  Dict Date: 08/25/2011 Dict Time: 03 04 PM    Dictation Date and Time:08/25/2011 15:04:04  Transcription Date and Time:08/25/2011 17:43:03  eScription Dictation id: [id number] Confirmation # :[id number]      cc: Westley Chandler MD    DICTATED BY: Marrianne Mood MDKarim [facility] MDD:08/25/2011 15:04:04 T:08/25/2011 17:43:03 2N Job#: [id number]

## 2011-08-28 ENCOUNTER — Encounter (HOSPITAL_BASED_OUTPATIENT_CLINIC_OR_DEPARTMENT_OTHER): Payer: Self-pay | Admitting: Internal Medicine

## 2011-08-29 ENCOUNTER — Telehealth (HOSPITAL_BASED_OUTPATIENT_CLINIC_OR_DEPARTMENT_OTHER): Payer: Self-pay | Admitting: Family Medicine

## 2011-08-29 NOTE — Progress Notes (Signed)
Called to discuss results with patient. UA + for blood, want to repeat UA, if persistent will send to uro-gyn. Also u/s is pending.    Sonya Gunnoe M. Katrinka Blazing, MD

## 2011-09-06 ENCOUNTER — Telehealth (HOSPITAL_BASED_OUTPATIENT_CLINIC_OR_DEPARTMENT_OTHER): Payer: Self-pay | Admitting: Family Medicine

## 2011-09-06 DIAGNOSIS — R319 Hematuria, unspecified: Principal | ICD-10-CM

## 2011-09-06 NOTE — Progress Notes (Signed)
Called patient again. Let her know that I want to repeat a UA. Order placed.   Also to go in for u/s if symptoms persist (abdominal pain).  Will send letter to home, this is 2nd call.    Amoree Newlon M. Katrinka Blazing, MD

## 2012-02-05 ENCOUNTER — Telehealth (HOSPITAL_BASED_OUTPATIENT_CLINIC_OR_DEPARTMENT_OTHER): Payer: Self-pay | Admitting: Internal Medicine

## 2012-02-05 NOTE — Progress Notes (Signed)
Confirmed with [facility], patient has refills on file for Omeprazole 20mg  from 05/30/11. Pharmacy will process refill request, patient will be able to pick up medication later today. No refill request made at this time.

## 2012-02-05 NOTE — Telephone Encounter (Signed)
Message copied by Aliene Altes on Mon Feb 05, 2012 12:51 PM  ------       Message from: Emelda Fear       Created: Mon Feb 05, 2012 12:47 PM       Regarding: needing refills/ out  of the medication        Contact: [phone]         [facility] IM[facility]              Person calling on behalf of patient:  Patient needing refills/ out  of the medication               May list multiple medications in this section              Medicine Name:omeprazole (PRILOSEC) 20 MG capsule                      Documented patient preferred pharmacies:        [facility] ([facility])       Phone: [phone] Fax: [phone]                                                   ------

## 2013-09-15 ENCOUNTER — Encounter (HOSPITAL_BASED_OUTPATIENT_CLINIC_OR_DEPARTMENT_OTHER): Payer: Self-pay | Admitting: Physician Assistant

## 2013-09-15 ENCOUNTER — Ambulatory Visit (HOSPITAL_BASED_OUTPATIENT_CLINIC_OR_DEPARTMENT_OTHER): Payer: PRIVATE HEALTH INSURANCE | Admitting: Physician Assistant

## 2013-09-15 VITALS — BP 117/65 | HR 75 | Temp 97.2°F | Wt 128.0 lb

## 2013-09-15 DIAGNOSIS — K5900 Constipation, unspecified: Secondary | ICD-10-CM

## 2013-09-15 DIAGNOSIS — R300 Dysuria: Secondary | ICD-10-CM

## 2013-09-15 DIAGNOSIS — Z7189 Other specified counseling: Secondary | ICD-10-CM

## 2013-09-15 DIAGNOSIS — K228 Other specified diseases of esophagus: Principal | ICD-10-CM

## 2013-09-15 DIAGNOSIS — Z1331 Encounter for screening for depression: Secondary | ICD-10-CM

## 2013-09-15 DIAGNOSIS — K2281 Esophageal polyp: Secondary | ICD-10-CM

## 2013-09-15 LAB — URINE DIP (POINT OF CARE)
BILIRUBIN, URINE: NEGATIVE
GLUCOSE, URINE: NEGATIVE mg/dl
KETONE, URINE: NEGATIVE mg/dl
LEUKOCYTE ESTERASE: NEGATIVE
NITRITE, URINE: NEGATIVE
PH URINE: 7 (ref 5.0–8.0)
PROTEIN, URINE: NEGATIVE mg/dl (ref 0–15)
SPECIFIC GRAVITY URINE: 1.02 (ref 1.003–1.030)
UROBILINOGEN URINE: 0.2 mg/dl (ref 0.2–1.0)

## 2013-09-15 NOTE — Patient Instructions (Signed)
Constipação em adultos   (Constipation, Adult)  A constipação é quando uma pessoa tem menos do que 3 evacuações por semana; tem dificuldade em evacuar; ou tem fezes secas, duras, ou maiores do que o normal. Conforme envelhecemos, a constipação é mais comum. Se tentar curar a constipação com medicamentos que o fazem evacuar (laxantes), o problema pode piorar. O uso prolongado de laxantes pode enfraquecer os músculos do cólon. Uma dieta pobre em fibras, não tomar líquidos o suficiente e tomar certos medicamentos pode piorar a constipação.   CAUSAS   · Certos medicamentos, como antidepressivos e para a dor, suplementos à base de ferro, antiácidos e diuréticos.    · Certas doenças, como diabetes, síndrome do intestino irritável (SII), doença da tireoide ou depressão.    · Não tomar água o suficiente.    · Não comer alimentos ricos em fibras.    · Estresse ou viagem.  · Falta de atividades físicas ou exercícios.  · Não ir ao banheiro quando tem vontade de evacuar.  · Ignorar a urgência em evacuar.  · Uso excessivo de laxantes.  SINTOMAS   · Ter menos do que 3 evacuações por semana.    · Esforço para evacuar.    · Ter fezes duras, secas ou maiores do que o normal.    · Sentir-se pesado ou inchado.    · Dor na parte inferior do abdômen.  · Não sentir alívio após a evacuação.  DIAGNÓSTICO   O médico tomará seu histórico e realizará um exame físico. Exames posteriores podem ser feitos para a constipação severa. Alguns exames podem incluir:   · Um raio-X de enema de bário para examinar o reto, cólon e algumas vezes, seu intestino delgado.  · Uma sigmoidoscopia para examinar a parte inferior de seu cólon.  · Uma colonoscopia para examinar todo o seu cólon.  TRATAMENTO   O tratamento dependerá da severidade de sua constipação e o que a está causando. Algumas dietas incluem tomar mais fluidos e comer alimentos mais ricos em fibras. Tratamentos de estilo de vida podem incluir exercícios regulares. Se essas recomendações de dieta  e estilo de vida não ajudarem, o médico pode recomendar tomar medicamentos laxantes de venda livre para ajudá-lo a evacuar. Medicamentos com receita médica podem ser prescritos se os de venda livre não funcionarem.   INSTRUÇÕES PARA TRATAMENTO DOMICILIAR   · Aumente a ingestão de fibras dietéticas em sua dieta, tais como frutas, vegetais, grãos integrais e feijão. Limite alimentos ricos em gordura e açúcar refinado em sua dieta, como batatas fritas, hambúrguer, biscoitos recheados, balas e refrigerante.    · Um suplemento de fibras pode ser adicionado à sua dieta se você não tiver fibras o suficiente dos alimentos.    · Beba líquidos o suficiente para manter sua urina clara ou na cor amarelo pálida.    · Faça exercícios regularmente, conforme receitado pelo seu médico.    · Vá para o banheiro quando tiver vontade de evacuar. Não prenda a evacuação.  · Tome medicamentos conforme as orientações de seu médico. Não tome nenhum outro medicamento para constipação sem consultar seu médico primeiro.  PROCURE UM MÉDICO IMEDIATAMENTE SE:   · Houver sangue vermelho vivo nas fezes.    · Sua constipação durar mais do que 4 Smiles ou piorar.    · Sentir dor abdominal ou retal.    · Tiver fezes finas tipo lápis.  · Tiver uma perda de peso inexplicada.  CERTIFIQUE-SE DE:   · Compreender estas instruções.  · Observar as suas condições.  · Procurar um médico imediatamente se não se sentir bem   ou piorar.  Document Released: 11/20/2008 Document Revised: 04/17/2011  Exit[employer]® Patient Information ©2014 [employer].

## 2013-09-15 NOTE — Progress Notes (Signed)
Subjective   TongaPortuguese telephone interpretation was used for this visit.     Christy Gonzales is a 38 year old female who has not been seen since June 2013. She presents for several reasons:    1.) Abdominal pain, dysuria, constipation. She has a known esophageal squamous papilloma and GERD- notes this is the same pain that she has had for years.     2.) Reports dysuria off and on for 4 months. Reports that she has not been sexually active for 4-5 years, no concern for STI. Denies hematuria, urinary urgency, urinary frequency.     3.) Also with constipation- chronic.  No blood in the stool, but notes a secretion coming out with the stool. Almost all the time has some mucus. Passes stool every two days, stool is hard. Eats a few servings of vegetables per day. Water intake 4-5 bottles per day. Has seen GI in the past for both upper and lower GI issues. Does need to f/u with GI for repeat endoscopy. Denies black/tarry stool, nausea.     ROS:  No TIA's or unusual headaches, no dysphagia. No prolonged  cough. No dyspnea or chest pain on exertion.     Patient Active Problem List:     Constipation     Gastritis     Anxiety     Stress     HH (hiatus hernia)     Esophageal squamous papilloma     Hematuria      Current Outpatient Prescriptions:  ranitidine (ZANTAC) 150 MG tablet Take 150 mg by mouth 2 (two) times daily. Disp:  Rfl:      No current facility-administered medications for this visit.  Review of Patient's Allergies indicates:  No Known Allergies    I have reviewed the past medical, social, and family history. All pertinent historical factors are mentioned in the HPI. The remainder are non-contributory.        Objective  BP 117/65   Pulse 75   Temp(Src) 97.2 F (36.2 C) (Temporal)   Wt 128 lb (58.06 kg)   SpO2 100%   LMP 09/15/2013  Appearance: Alert and oriented in NAD, non-antalgic gait, and transitions easily from exam table.  Skin: Warm, dry, and without significant lesions or rashes.   HEENT: Head is  normocephalic, atraumatic. EOMi bilaterally, no scleral icterus or conjunctival injection.  External ear canals clear with normal TMs bilaterally. OP pink, moist, with no exudates or tonsillar adenopathy.    Lymph: No tonsillar, superficial cervical, supra- or infraclavicular lymphadenopathy.   Cardiac: RRR, no M/R/G.   Respiratory: CTA bilaterally, no W/R/R.   Abdomen: Normal BS x 4, soft, minimally tender throughout abdomen, no masses palpated and no pulsatile masses. No hepatosplenomegaly. No CVA tenderness.   Vasc:  No cyanosis or clubbing.      Assessment and Plan:  (530.89) Esophageal squamous papilloma  (primary encounter diagnosis)  Comment: Missed one year f/u endoscopy. Should be seen again. Referred, ?GERD related to this.   Plan: REFERRAL TO GASTROENTEROLOGY ( INT)    (788.1) Dysuria  Comment: Urine dip moderate blood. Has menses so likely menstrual. Will send culture to make sure. Unclear etiology.    Plan: URINE DIP (POINT OF CARE), URINE CULTURE    (564.00) Constipation  Comment: Increase water intake to 8-8oz glasses of water per day or more. Increase fiber intake. Should have 5 servings (handfuls) of fruits and vegetables per day. Can also use an OTC fiber supplement if cannot get these servings in.  Can eat 3-5 prunes per day. Increase exercise. Consider stool softener if no relief.   Plan: F/U with GI.     (V79.0) Screening for depression  Comment: PHQ9: 3.   Plan: Negative screen.     (V65.49) Advance care planning  Comment: HCP completed and discussed.   Plan: HEALTH CARE PROXY    We discussed new and current medications and the importance of medication compliance. The patient was ready to learn and no apparent learning barriers were identified. I explained the diagnosis and treatment plan, and the patient expressed understanding of the content. Possible side effects of the prescribed medication(s) were explained.  I attempted to answer any questions regarding the diagnosis and the proposed  treatment.  Signed by Percival Spanish, PA-C

## 2013-09-16 LAB — URINE CULTURE

## 2013-09-18 ENCOUNTER — Encounter (HOSPITAL_BASED_OUTPATIENT_CLINIC_OR_DEPARTMENT_OTHER): Payer: Self-pay | Admitting: Physician Assistant

## 2014-02-16 ENCOUNTER — Ambulatory Visit (HOSPITAL_BASED_OUTPATIENT_CLINIC_OR_DEPARTMENT_OTHER): Payer: PRIVATE HEALTH INSURANCE | Admitting: Gastroenterology

## 2014-06-12 ENCOUNTER — Other Ambulatory Visit (HOSPITAL_BASED_OUTPATIENT_CLINIC_OR_DEPARTMENT_OTHER): Payer: Self-pay

## 2014-06-12 NOTE — Telephone Encounter (Signed)
[facility]

## 2020-04-15 ENCOUNTER — Encounter (HOSPITAL_BASED_OUTPATIENT_CLINIC_OR_DEPARTMENT_OTHER): Payer: Self-pay

## 2023-01-12 IMAGING — MR e+1 RM DE COLUNA [PERSON_NAME]
4 of 5 series · 19 of 48 positions shown · non-contrast
Comparison: none

[Series 102: T2 · sagittal · 3.0mm · 0.47mm/px · 6 of 12 slices shown (1 of 2)]
[im 1/12]
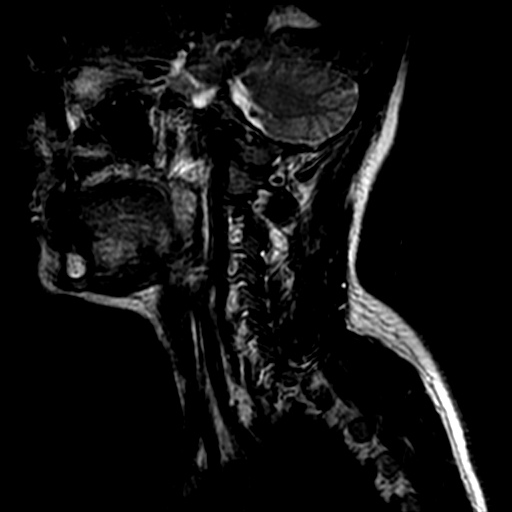
[im 3/12]
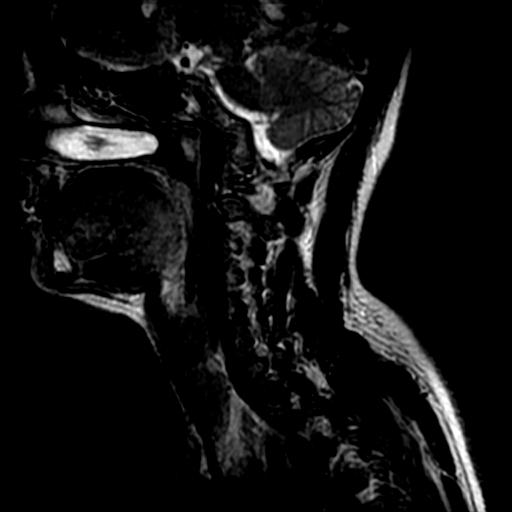
[im 5/12]
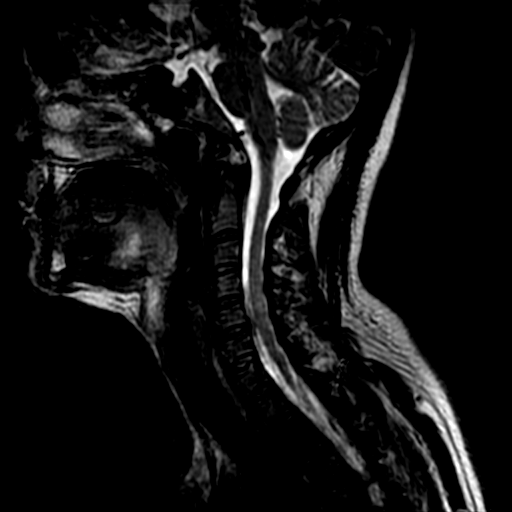
[im 7/12]
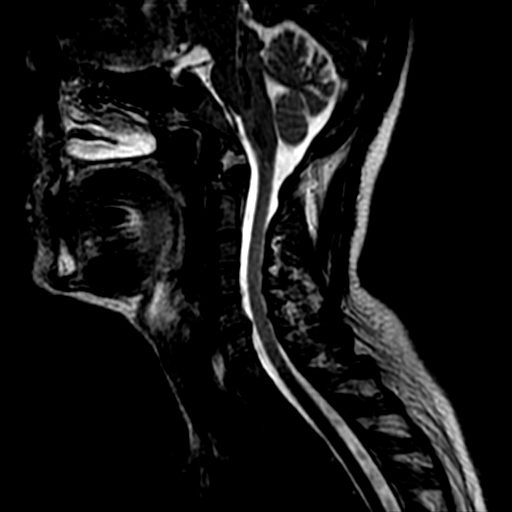
[im 9/12]
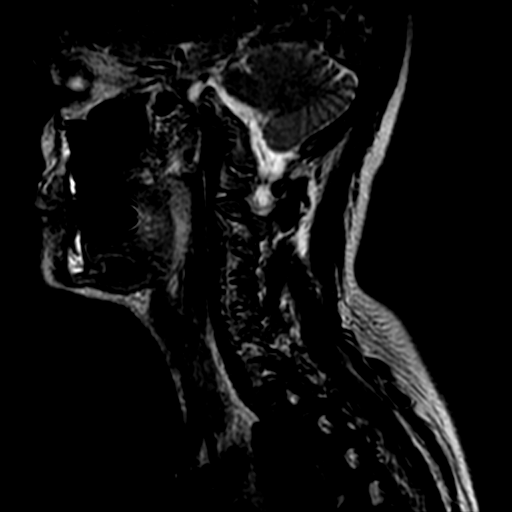
[im 12/12]
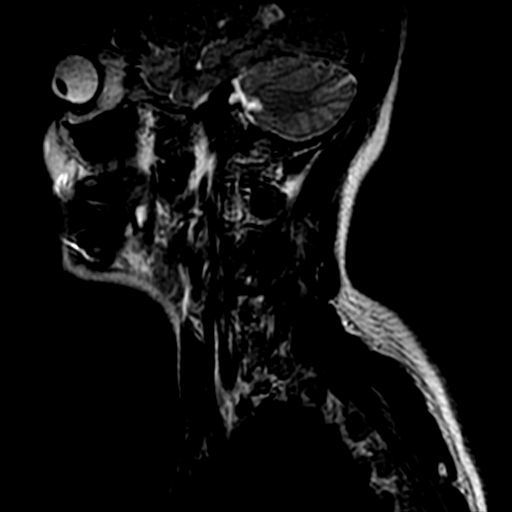

[Series 103: T1 · sagittal · 3.0mm · 0.47mm/px · 3 of 12 slices shown (1 of 2)]
[im 2/12]
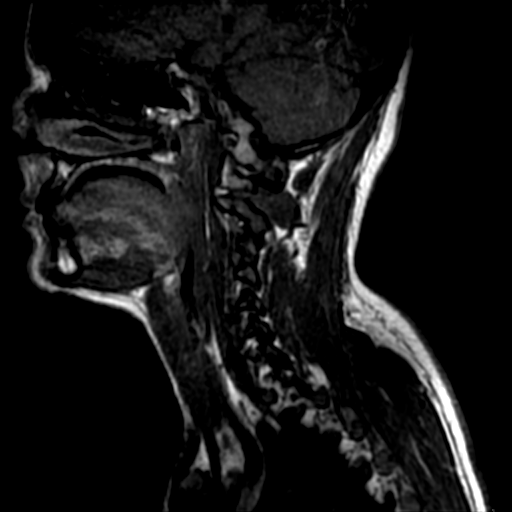
[im 6/12]
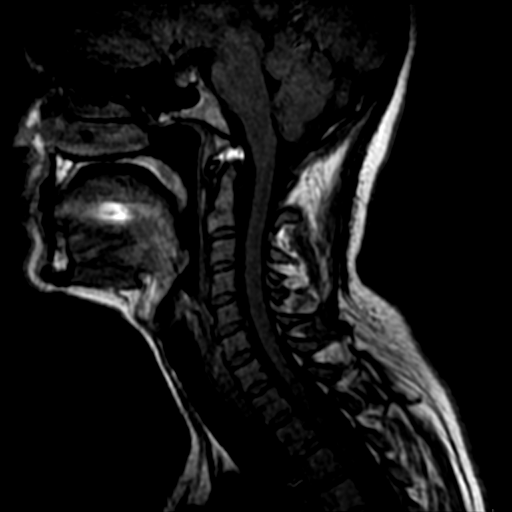
[im 10/12]
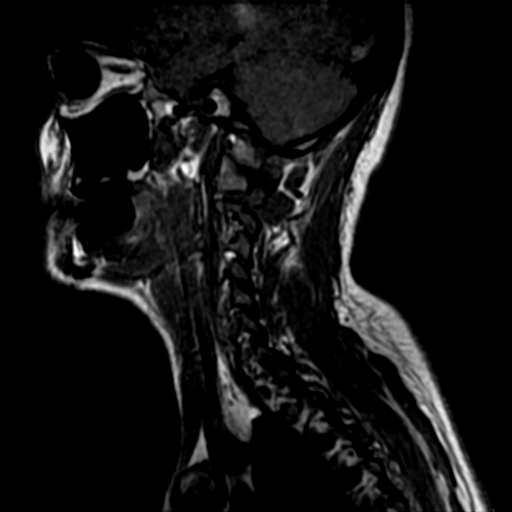

[Series 105: T2 · axial · 3.0mm · 0.39mm/px · z∈[-74,+2]mm · 7 of 24 slices shown (2 of 2)]
[im 1/24]
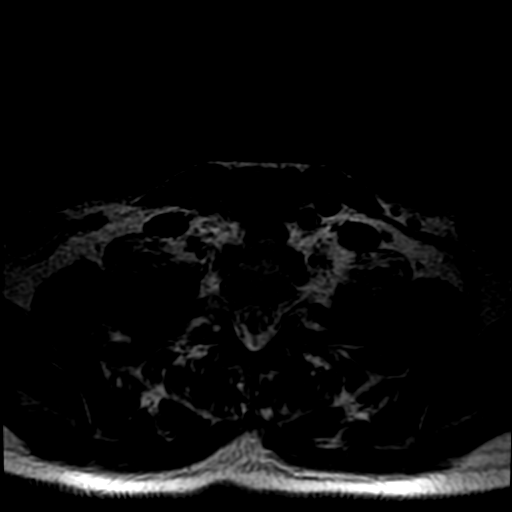
[im 4/24]
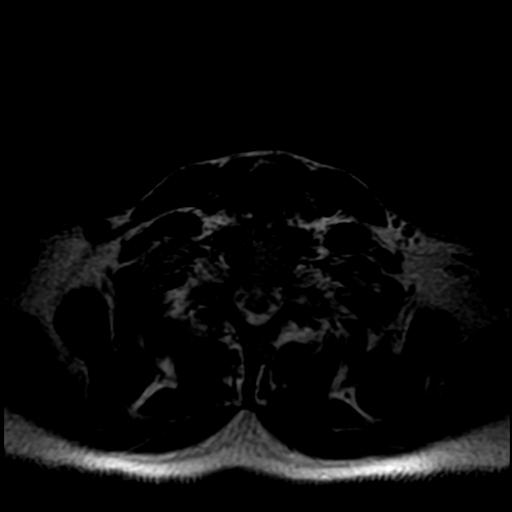
[im 8/24]
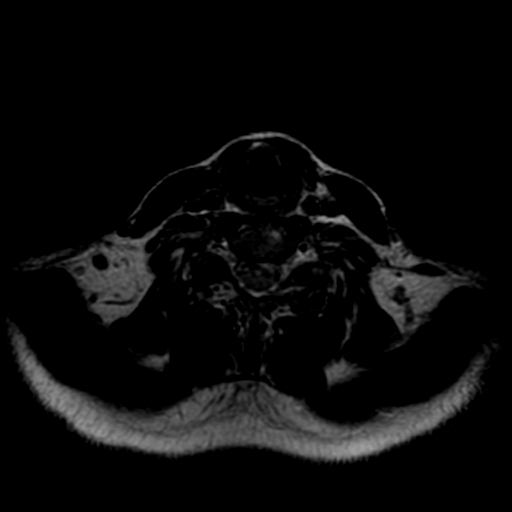
[im 11/24]
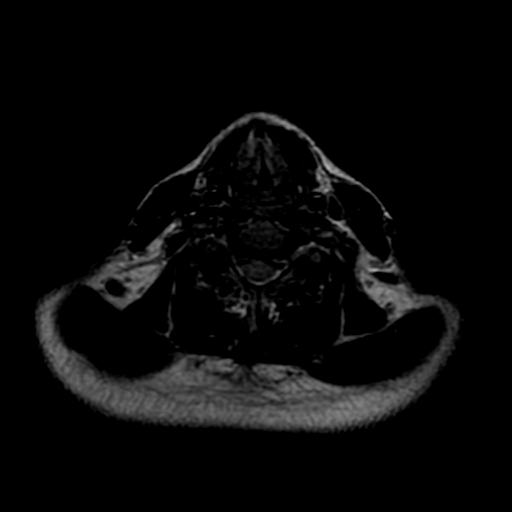
[im 13/24]
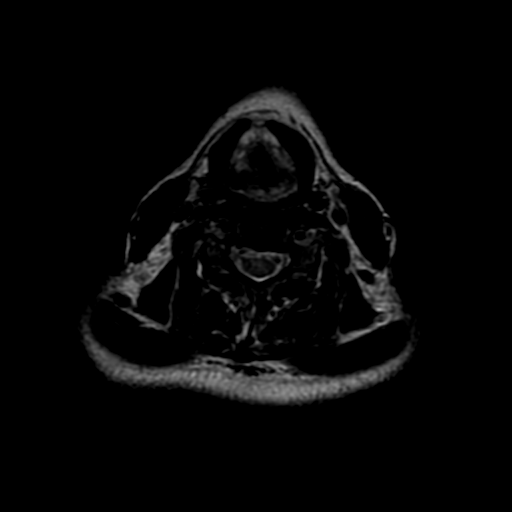
[im 16/24]
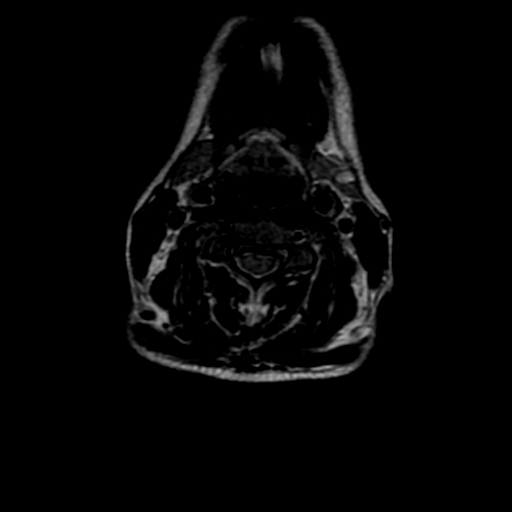
[im 20/24]
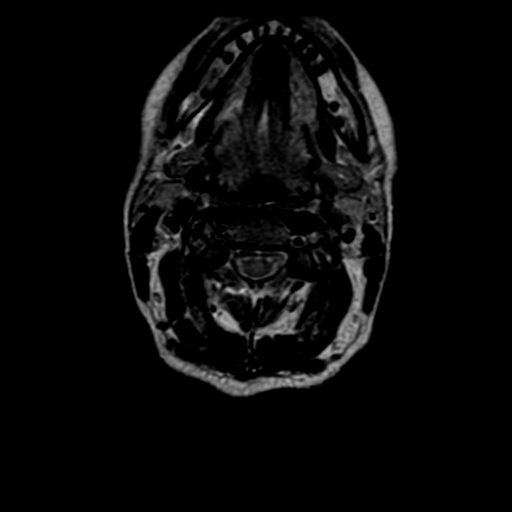

[Series 106: T1 · axial · 3.0mm · 0.39mm/px · z∈[-62,+2]mm · 3 of 24 slices shown (2 of 2)]
[im 4/24]
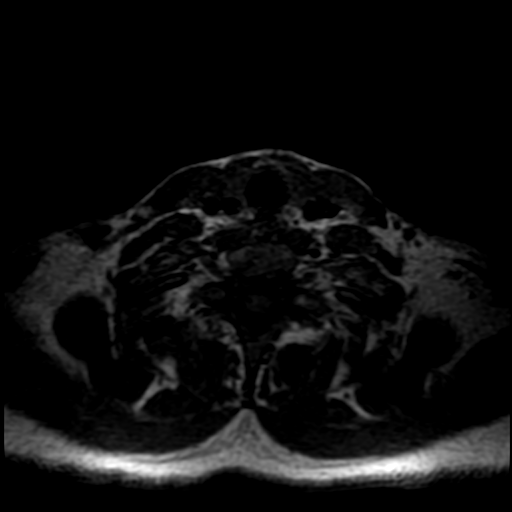
[im 13/24]
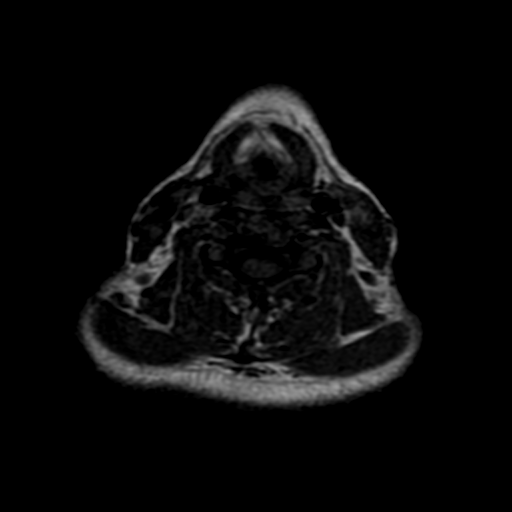
[im 20/24]
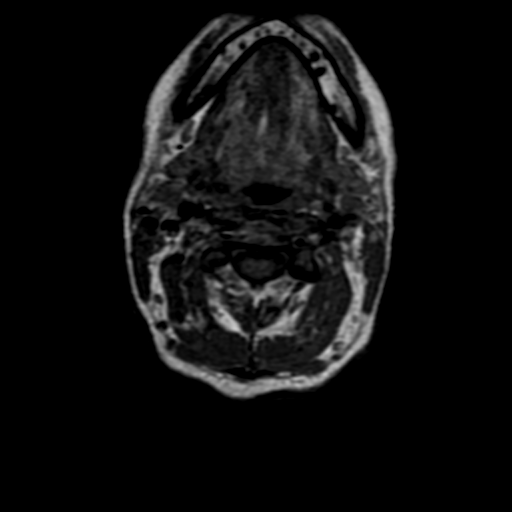

[19 of 48 positions shown; findings below may reference images not displayed]

RESSONÂNCIA MAGNÉTICA DE COLUNA CERVICAL

TÉCNICA: Exame realizado em equipamento de ressonância magnética com sequências, ponderações e planos específicos para o segmento de interesse, sem a administração endovenosa do meio de contraste.

RESULTADO:
Os corpos vertebrais estudados são alinhados e apresentam altura, forma e intensidade de sinal usuais, com discretos osteófitos marginais.
Elementos posteriores estudados íntegros.
O canal vertebral ósseo apresenta amplitude usual.
Complexo disco-osteofitário posteromediano no nível C5-C6, que projeta se para o interior do canal vertebral e toca o saco dural na face ventral.
Forames de conjugação C3-C4 direito, C5-C6 e C6-C7 bilateral com redução da amplitude secundário a uncoartrose.
Os demais forames de conjugação estudados são livres e apresentam amplitudes usuais.
A medula apresenta forma, dimensões e intensidade de sinal usuais nas regiõesestudadas.

CONCLUSÃO:
Espondiloartrose cervical.
Complexo disco-osteofitário posteromediano no nível C5-C6.
Estenose foraminal direita no nível C3-C4 e bilateral nos níveis C5-C6 e C6-C7.

## 2023-01-12 IMAGING — MR e+1 RM DE COLUNA [PERSON_NAME]
4 of 6 series · 19 of 48 positions shown · non-contrast
Comparison: none

[Series 102: T2 · sagittal · 4.0mm · 0.59mm/px · 8 of 12 slices shown (1 of 2)]
[im 1/12]
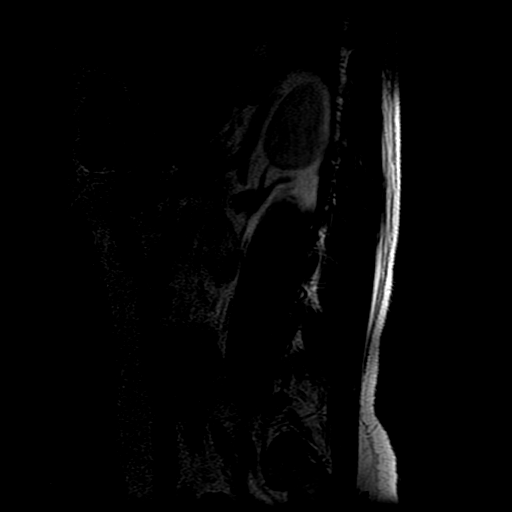
[im 2/12]
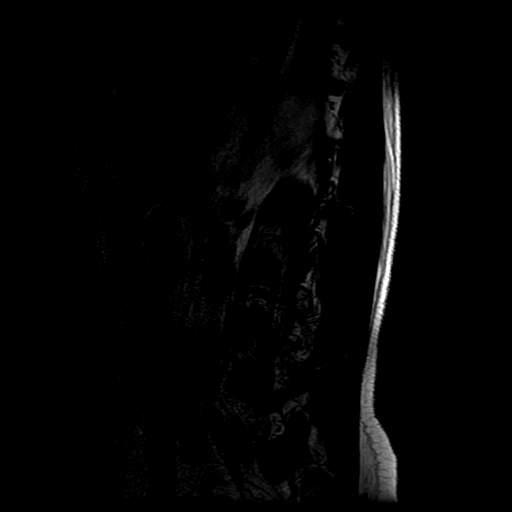
[im 4/12]
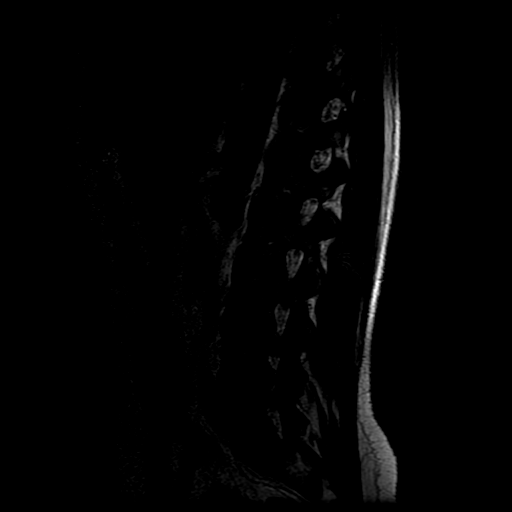
[im 5/12]
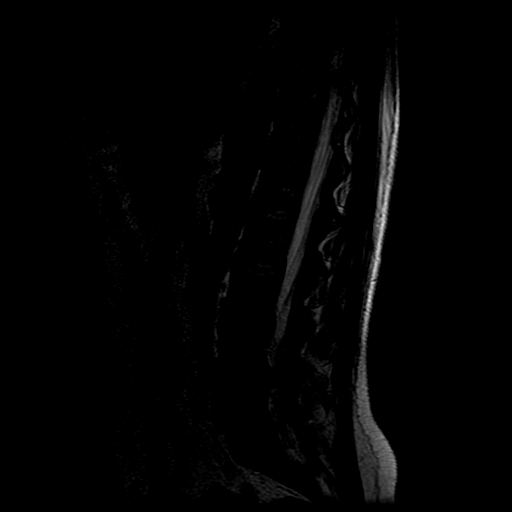
[im 7/12]
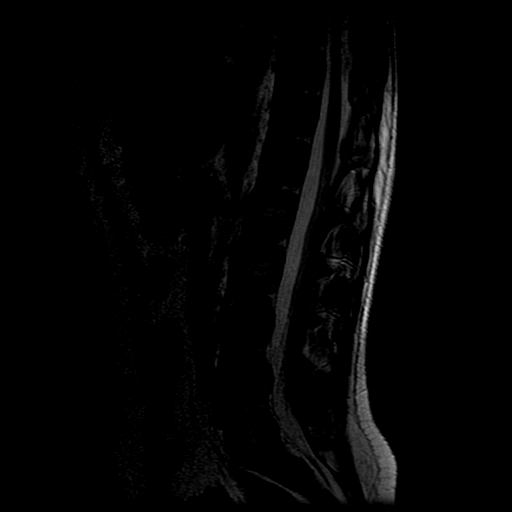
[im 8/12]
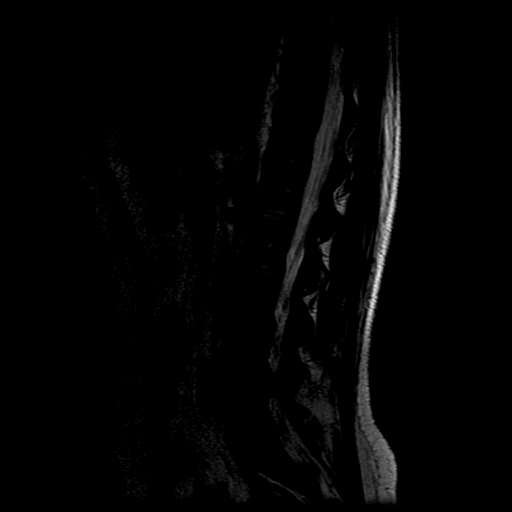
[im 10/12]
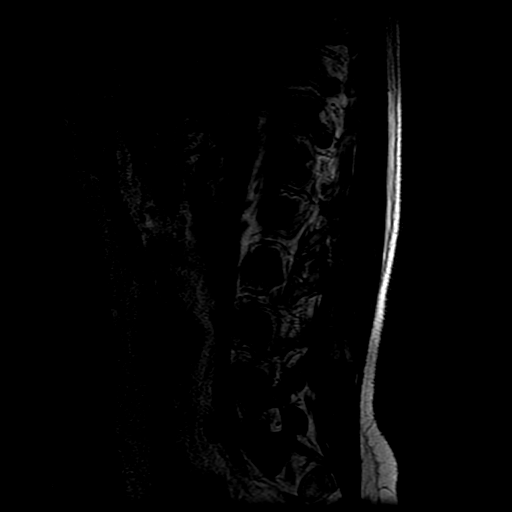
[im 12/12]
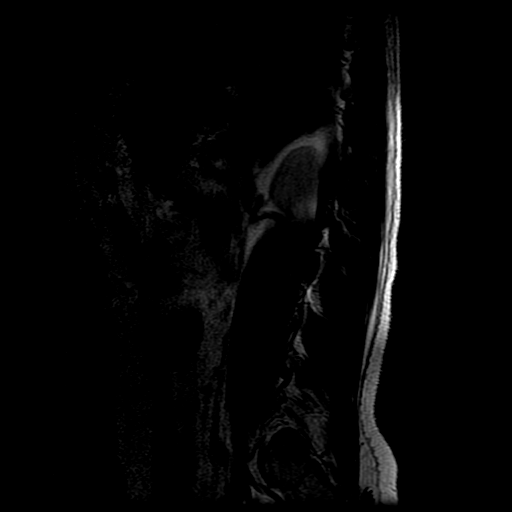

[Series 103: T1 · sagittal · 4.0mm · 0.59mm/px · 3 of 12 slices shown (1 of 2)]
[im 2/12]
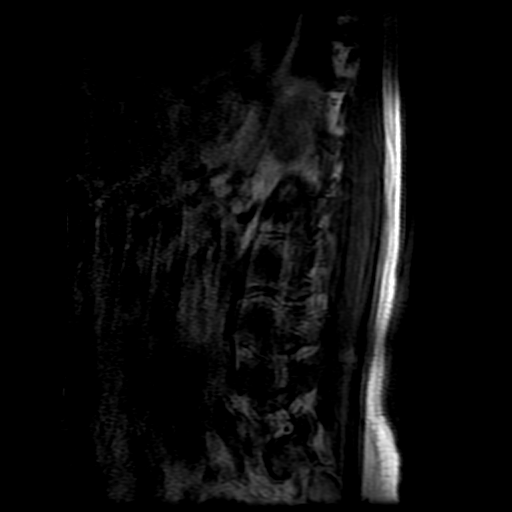
[im 6/12]
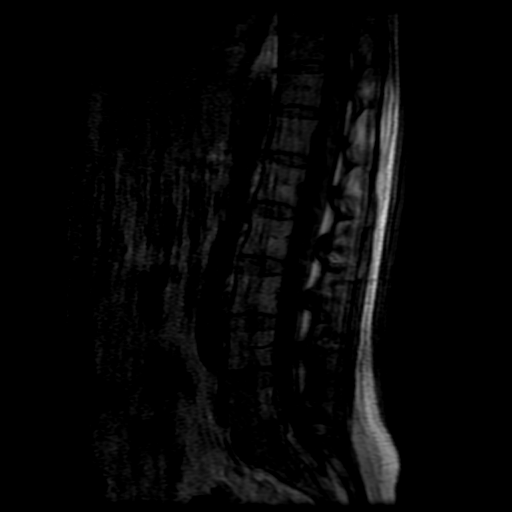
[im 10/12]
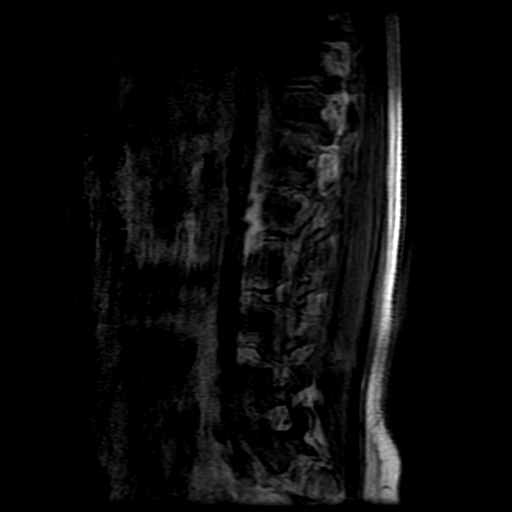

[Series 106: T1 · sagittal · 4.0mm · 0.59mm/px · 3 of 12 slices shown (2 of 2)]
[im 2/12]
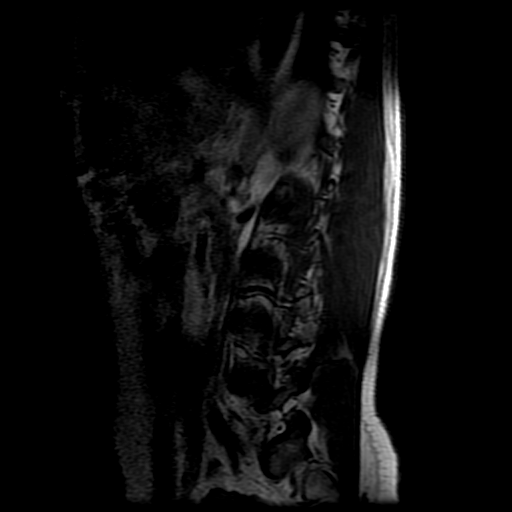
[im 6/12]
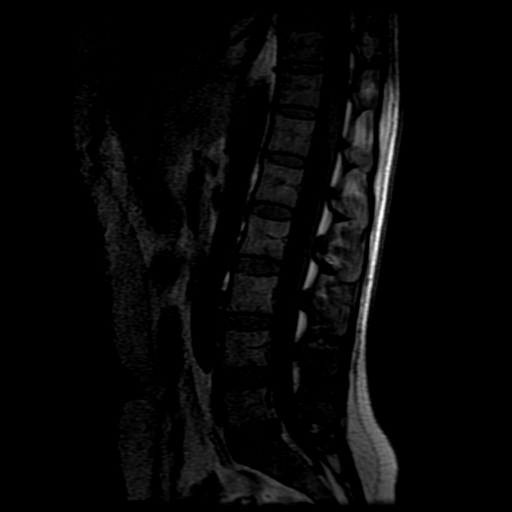
[im 10/12]
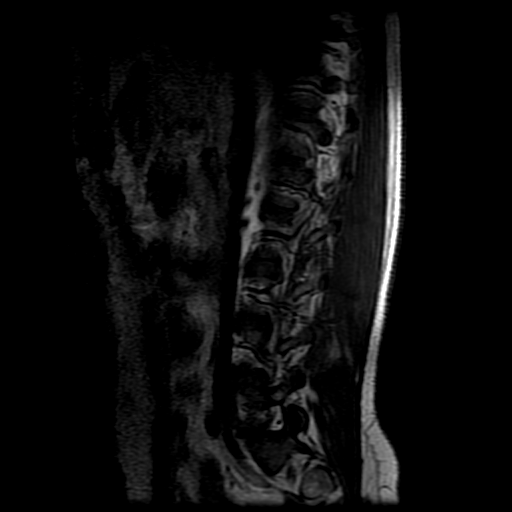

[Series 107: T2 · axial · 4.0mm · 0.47mm/px · z∈[-100,+82]mm · 5 of 20 slices shown (2 of 2)]
[im 1/20]
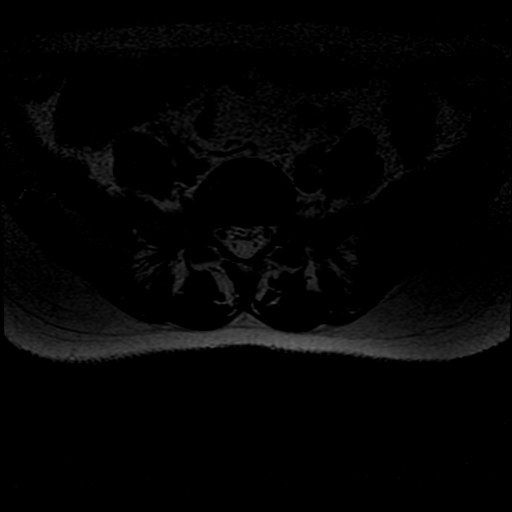
[im 4/20]
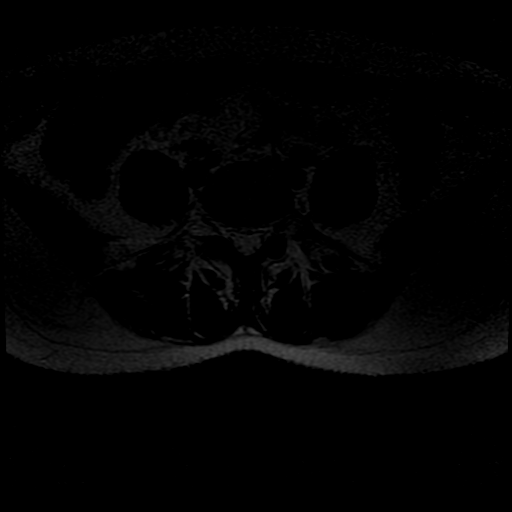
[im 6/20]
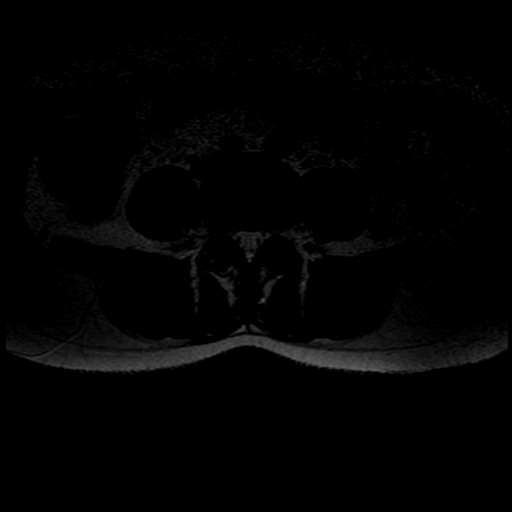
[im 11/20]
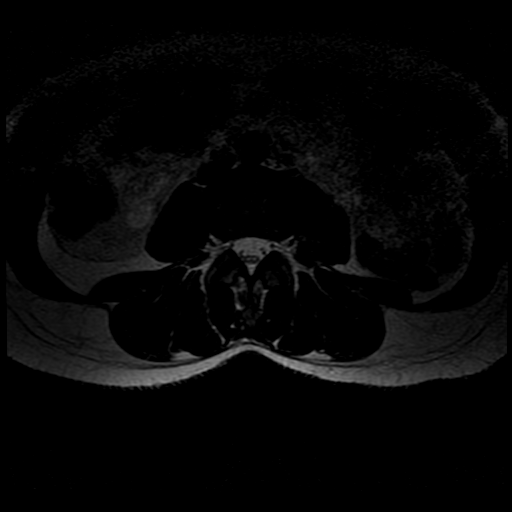
[im 18/20]
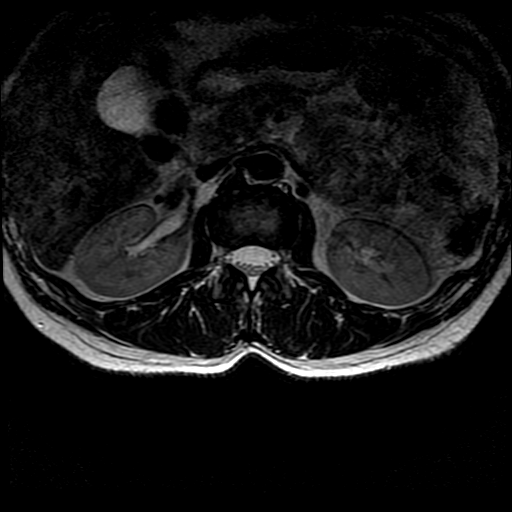

[19 of 48 positions shown; findings below may reference images not displayed]

RESSONÂNCIA MAGNÉTICA DE COLUNA LOMBAR

TÉCNICA:
Exame realizado em equipamento de ressonância magnética com sequências, ponderações e planos específicos para o segmento de interesse, sem a administração endovenosa do meio de contraste.
RESULTADO:

Os corpos vertebrais visualizados são alinhados e apresentam formas, dimensões e intensidade de sinal usuais, com osteófitos marginais.
Elementos posteriores visualizados íntegros.
Hipertrofia das apófises articulares lombares
O canal vertebral ósseo apresenta amplitude usual na região estudada.
Nos níveis L3-L4 e L4-L5, os discos intervertebrais apresentam abaulamento difuso do contorno posterior, do projetam-se para o interior do canal vertebral e toca o saco dural na face ventral.
Disco intervertebral L4-L5 abaulamento difuso do contorno posterior, que projeta se para o interior do canal vertebral e toca o saco dural na face ventral.
Os demais discos intervertebrais apresentam altura e intensidade de sinal usuais.
Os forames de conjugação visualizados são livres e apresentam amplitudes usuais.
Não há evidência de processo expansivo intracanalar.
O cone medular é tópico e tem aspecto anatômico.
CONCLUSÃO:
Espondiloartrose lombossacra.
Abaulamento discal difuso no nível L4-L5 e L5-S1.

## 2023-01-12 IMAGING — MR e+1 RM DE COLUNA TORAC
4 of 6 series · 22 of 48 positions shown · non-contrast
Comparison: none

[Series 103: T2 · sagittal · 3.5mm · 0.74mm/px · 4 of 12 slices shown (1 of 2)]
[im 1/12]
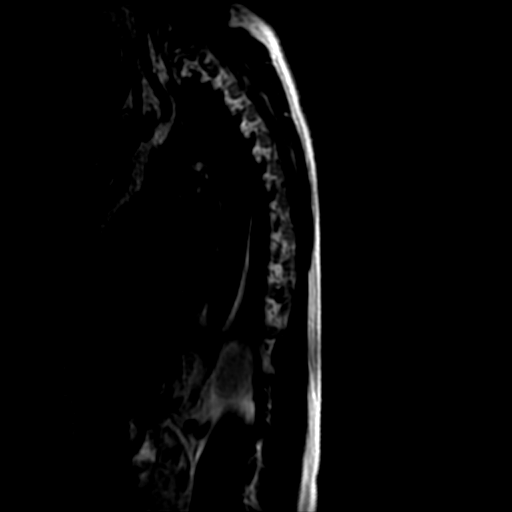
[im 4/12]
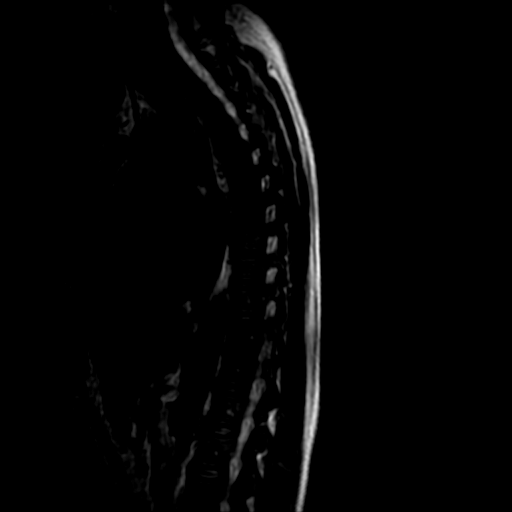
[im 8/12]
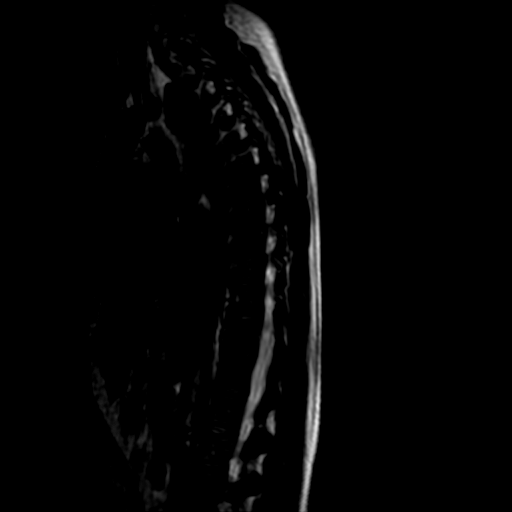
[im 12/12]
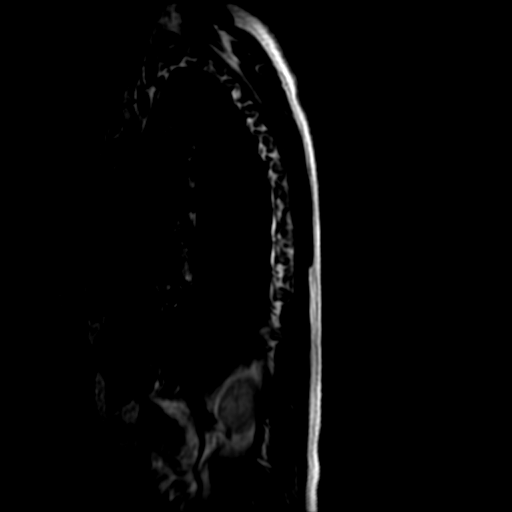

[Series 104: T1 · sagittal · 3.5mm · 1.48mm/px · 4 of 12 slices shown (1 of 2)]
[im 1/12]
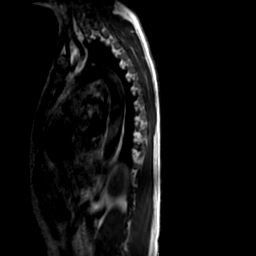
[im 4/12]
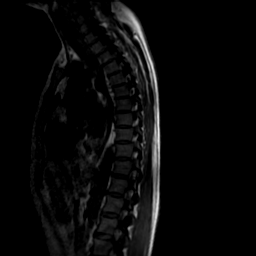
[im 8/12]
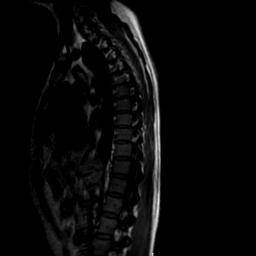
[im 12/12]
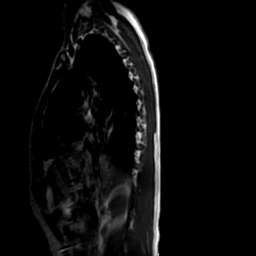

[Series 106: T2 · axial · 6.0mm · 0.39mm/px · z∈[-310,-50]mm · 10 of 43 slices shown (2 of 2)]
[im 3/43]
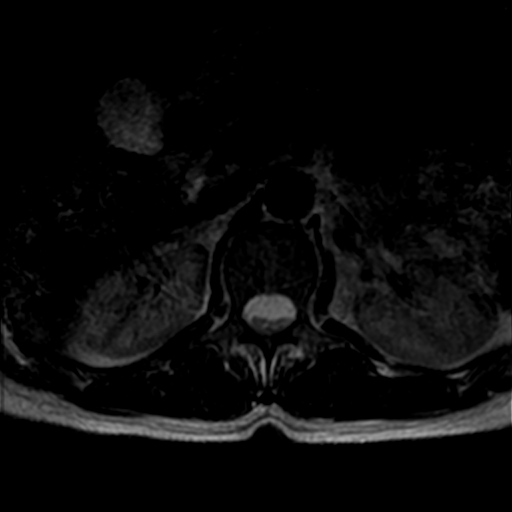
[im 6/43]
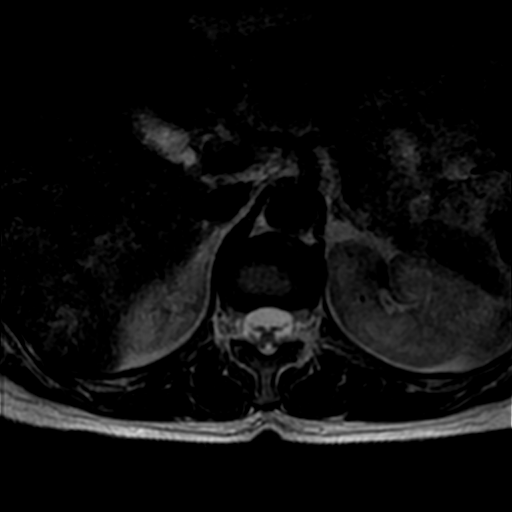
[im 9/43]
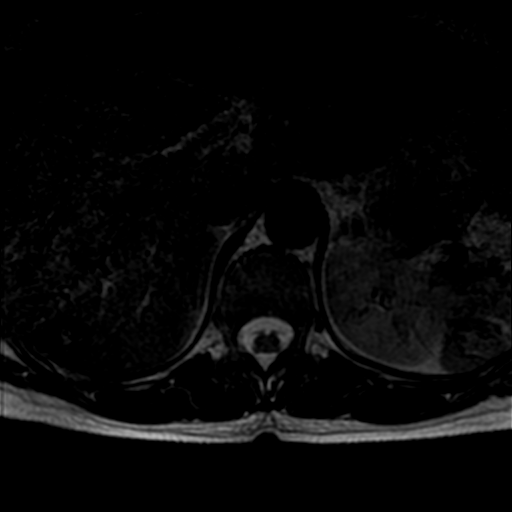
[im 15/43]
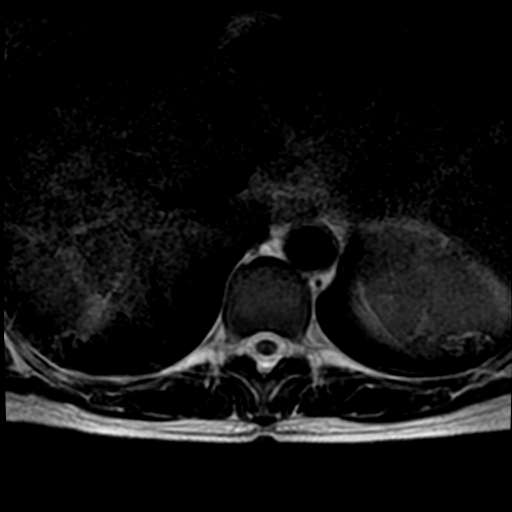
[im 20/43]
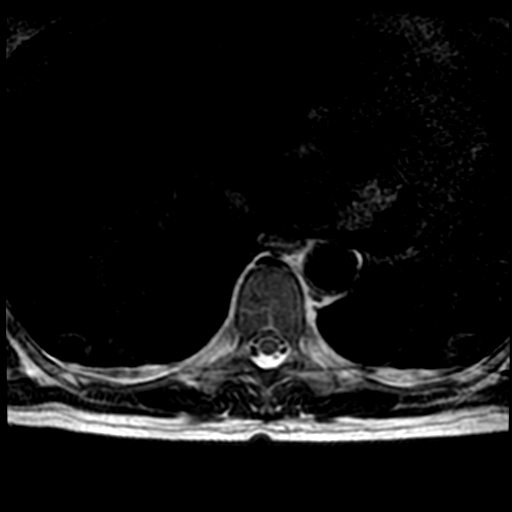
[im 23/43]
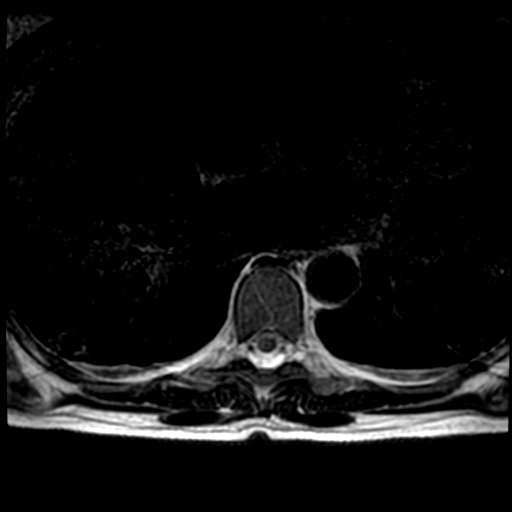
[im 26/43]
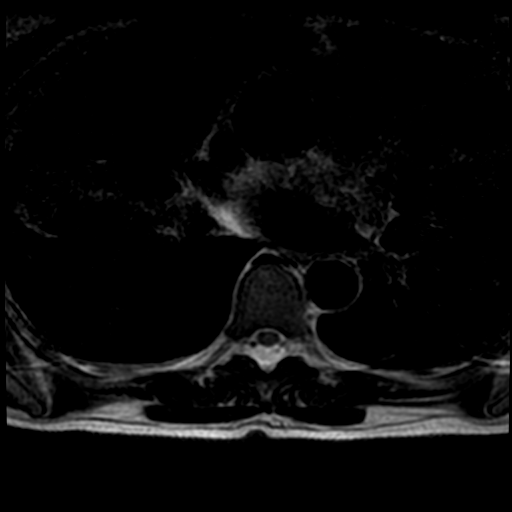
[im 31/43]
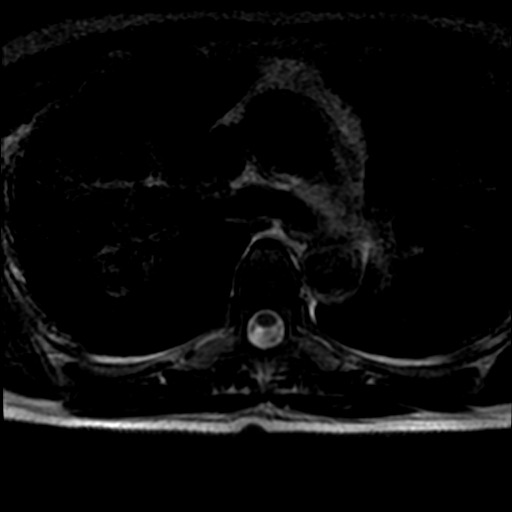
[im 37/43]
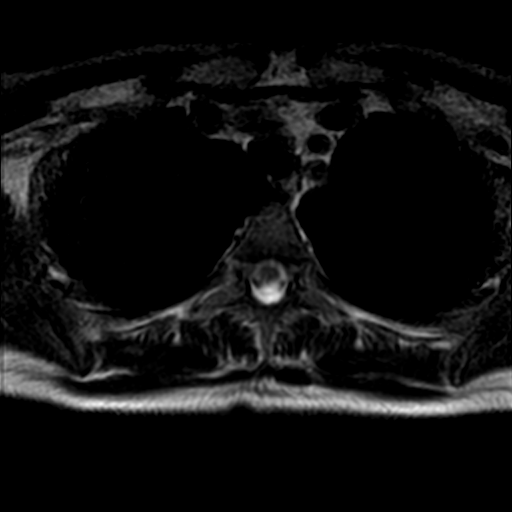
[im 43/43]
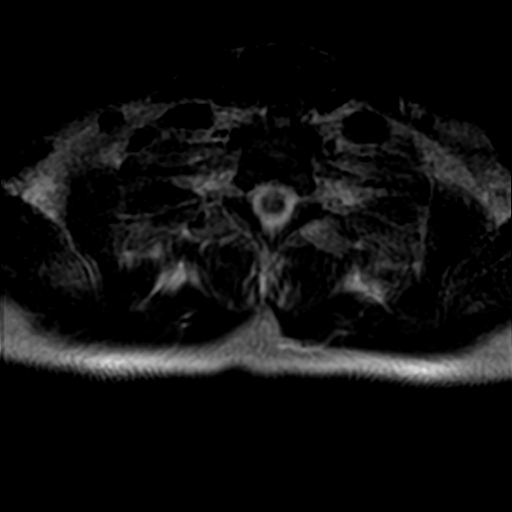

[Series 107: T1 · axial · 6.0mm · 0.39mm/px · z∈[-310,-89]mm · 4 of 43 slices shown (2 of 2)]
[im 3/43]
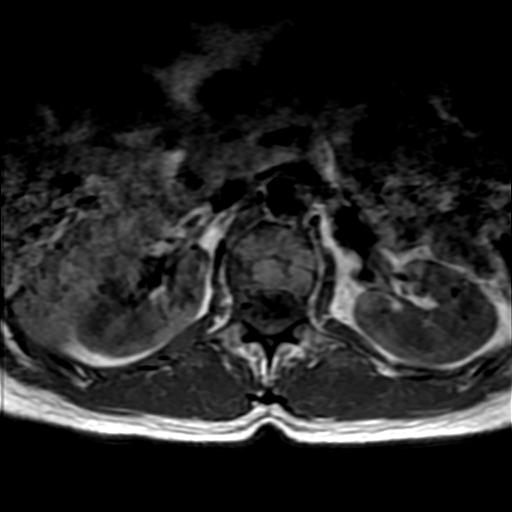
[im 6/43]
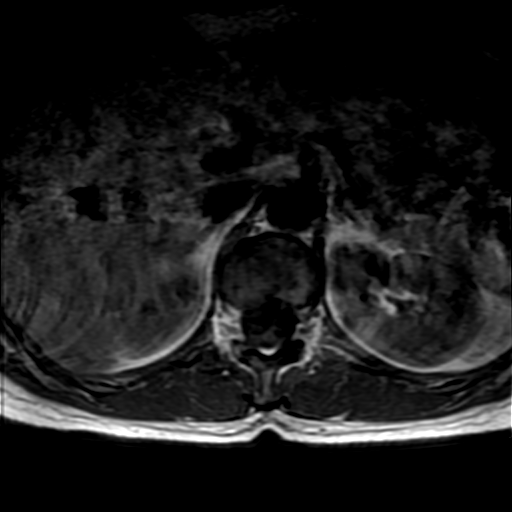
[im 23/43]
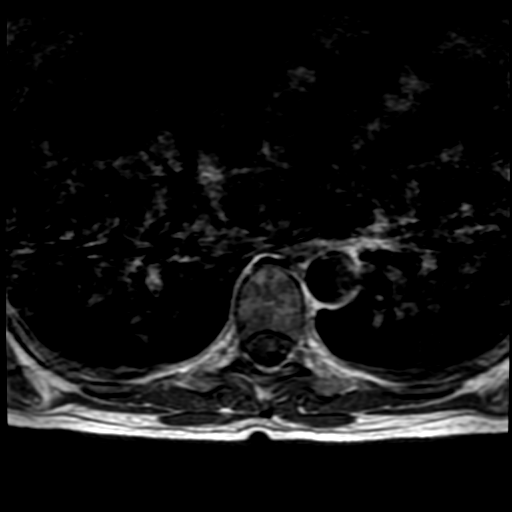
[im 37/43]
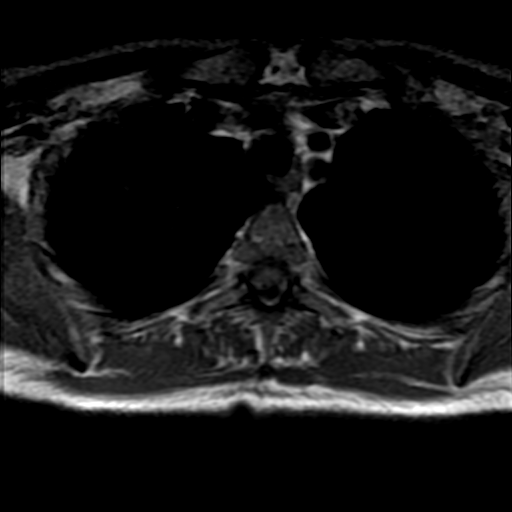

[22 of 48 positions shown; findings below may reference images not displayed]

RESSONÂNCIA MAGNÉTICA DE COLUNA TORÁCICA

TÉCNICA:
Exame realizado em equipamento de ressonância magnética com sequências, ponderações e planos específicos para o segmento de interesse, sem a administração endovenosa do meio de contraste.

RESULTADO:
Os corpos vertebrais estudados são alinhados e apresentam altura, forma e intensidade de sinal usuais, com discretos osteófitos marginais.
Elementos posteriores estudados íntegros.
O canal vertebral ósseo apresenta amplitude usual.
Os discos intervertebrais têm altura e sinal usuais.
Não há evidências de protrusões discais significativas na região estudada.
Os forames de conjugação estudados são livres e apresentam amplitudes usuais.
A medula torácica e o cone medular apresentam forma, dimensões e intensidade de sinal usuais.

CONCLUSÃO:
Leve espondilose dorsal.

## 2023-05-10 ENCOUNTER — Other Ambulatory Visit: Payer: Self-pay | Admitting: Internal Medicine
